# Patient Record
Sex: Female | Born: 1940 | Race: White | Hispanic: No | Smoking: Never smoker
Health system: Southern US, Community
[De-identification: ages and names within clinical notes are randomized; demographics above are authoritative.]

## PROBLEM LIST (undated history)

## (undated) DIAGNOSIS — E0590 Thyrotoxicosis, unspecified without thyrotoxic crisis or storm: Secondary | ICD-10-CM

## (undated) DIAGNOSIS — F329 Major depressive disorder, single episode, unspecified: Secondary | ICD-10-CM

## (undated) DIAGNOSIS — I10 Essential (primary) hypertension: Secondary | ICD-10-CM

## (undated) DIAGNOSIS — E0500 Thyrotoxicosis with diffuse goiter without thyrotoxic crisis or storm: Secondary | ICD-10-CM

## (undated) DIAGNOSIS — E669 Obesity, unspecified: Secondary | ICD-10-CM

## (undated) DIAGNOSIS — G4700 Insomnia, unspecified: Secondary | ICD-10-CM

## (undated) DIAGNOSIS — C50919 Malignant neoplasm of unspecified site of unspecified female breast: Secondary | ICD-10-CM

## (undated) DIAGNOSIS — K219 Gastro-esophageal reflux disease without esophagitis: Secondary | ICD-10-CM

## (undated) DIAGNOSIS — F411 Generalized anxiety disorder: Secondary | ICD-10-CM

## (undated) DIAGNOSIS — F32A Depression, unspecified: Secondary | ICD-10-CM

## (undated) DIAGNOSIS — M549 Dorsalgia, unspecified: Secondary | ICD-10-CM

## (undated) DIAGNOSIS — I480 Paroxysmal atrial fibrillation: Secondary | ICD-10-CM

## (undated) DIAGNOSIS — J309 Allergic rhinitis, unspecified: Secondary | ICD-10-CM

## (undated) DIAGNOSIS — K589 Irritable bowel syndrome without diarrhea: Secondary | ICD-10-CM

## (undated) DIAGNOSIS — E785 Hyperlipidemia, unspecified: Secondary | ICD-10-CM

## (undated) HISTORY — PX: EYE SURGERY: SHX253

## (undated) HISTORY — DX: Hyperlipidemia, unspecified: E78.5

## (undated) HISTORY — DX: Insomnia, unspecified: G47.00

## (undated) HISTORY — DX: Depression, unspecified: F32.A

## (undated) HISTORY — DX: Thyrotoxicosis, unspecified without thyrotoxic crisis or storm: E05.90

## (undated) HISTORY — DX: Obesity, unspecified: E66.9

## (undated) HISTORY — DX: Essential (primary) hypertension: I10

## (undated) HISTORY — DX: Malignant neoplasm of unspecified site of unspecified female breast: C50.919

## (undated) HISTORY — DX: Gastro-esophageal reflux disease without esophagitis: K21.9

## (undated) HISTORY — DX: Irritable bowel syndrome, unspecified: K58.9

## (undated) HISTORY — PX: BREAST LUMPECTOMY: SHX2

## (undated) HISTORY — DX: Thyrotoxicosis with diffuse goiter without thyrotoxic crisis or storm: E05.00

## (undated) HISTORY — DX: Dorsalgia, unspecified: M54.9

## (undated) HISTORY — DX: Generalized anxiety disorder: F41.1

## (undated) HISTORY — DX: Allergic rhinitis, unspecified: J30.9

## (undated) HISTORY — DX: Paroxysmal atrial fibrillation: I48.0

## (undated) HISTORY — DX: Major depressive disorder, single episode, unspecified: F32.9

---

## 1998-09-05 ENCOUNTER — Other Ambulatory Visit: Admission: RE | Admit: 1998-09-05 | Discharge: 1998-09-05 | Payer: Self-pay | Admitting: Obstetrics and Gynecology

## 2000-06-16 ENCOUNTER — Encounter: Payer: Self-pay | Admitting: *Deleted

## 2000-06-16 ENCOUNTER — Encounter: Admission: RE | Admit: 2000-06-16 | Discharge: 2000-06-16 | Payer: Self-pay | Admitting: *Deleted

## 2000-06-23 ENCOUNTER — Encounter: Admission: RE | Admit: 2000-06-23 | Discharge: 2000-06-23 | Payer: Self-pay | Admitting: *Deleted

## 2000-06-23 ENCOUNTER — Encounter: Payer: Self-pay | Admitting: *Deleted

## 2000-06-23 ENCOUNTER — Other Ambulatory Visit: Admission: RE | Admit: 2000-06-23 | Discharge: 2000-06-23 | Payer: Self-pay | Admitting: *Deleted

## 2000-06-30 ENCOUNTER — Other Ambulatory Visit: Admission: RE | Admit: 2000-06-30 | Discharge: 2000-06-30 | Payer: Self-pay | Admitting: *Deleted

## 2000-07-30 ENCOUNTER — Encounter: Payer: Self-pay | Admitting: Surgery

## 2000-07-31 ENCOUNTER — Encounter (INDEPENDENT_AMBULATORY_CARE_PROVIDER_SITE_OTHER): Payer: Self-pay | Admitting: Specialist

## 2000-07-31 ENCOUNTER — Encounter: Payer: Self-pay | Admitting: Surgery

## 2000-07-31 ENCOUNTER — Ambulatory Visit (HOSPITAL_COMMUNITY): Admission: RE | Admit: 2000-07-31 | Discharge: 2000-08-01 | Payer: Self-pay | Admitting: Surgery

## 2000-08-12 ENCOUNTER — Encounter: Admission: RE | Admit: 2000-08-12 | Discharge: 2000-11-10 | Payer: Self-pay | Admitting: Radiation Oncology

## 2000-08-26 ENCOUNTER — Ambulatory Visit (HOSPITAL_COMMUNITY): Admission: RE | Admit: 2000-08-26 | Discharge: 2000-08-26 | Payer: Self-pay | Admitting: Oncology

## 2000-08-26 ENCOUNTER — Encounter: Payer: Self-pay | Admitting: Oncology

## 2000-08-28 ENCOUNTER — Encounter: Payer: Self-pay | Admitting: Surgery

## 2000-08-28 ENCOUNTER — Ambulatory Visit (HOSPITAL_COMMUNITY): Admission: RE | Admit: 2000-08-28 | Discharge: 2000-08-28 | Payer: Self-pay | Admitting: Surgery

## 2000-09-01 ENCOUNTER — Ambulatory Visit (HOSPITAL_COMMUNITY): Admission: RE | Admit: 2000-09-01 | Discharge: 2000-09-01 | Payer: Self-pay | Admitting: Oncology

## 2001-04-01 ENCOUNTER — Ambulatory Visit: Admission: RE | Admit: 2001-04-01 | Discharge: 2001-06-30 | Payer: Self-pay | Admitting: Radiation Oncology

## 2001-04-28 ENCOUNTER — Encounter: Admission: RE | Admit: 2001-04-28 | Discharge: 2001-04-28 | Payer: Self-pay | Admitting: Radiation Oncology

## 2001-07-01 ENCOUNTER — Ambulatory Visit: Admission: RE | Admit: 2001-07-01 | Discharge: 2001-09-29 | Payer: Self-pay | Admitting: Radiation Oncology

## 2001-08-17 ENCOUNTER — Ambulatory Visit: Admission: RE | Admit: 2001-08-17 | Discharge: 2001-08-17 | Payer: Self-pay | Admitting: Radiation Oncology

## 2001-09-28 ENCOUNTER — Ambulatory Visit (HOSPITAL_BASED_OUTPATIENT_CLINIC_OR_DEPARTMENT_OTHER): Admission: RE | Admit: 2001-09-28 | Discharge: 2001-09-28 | Payer: Self-pay | Admitting: Surgery

## 2001-11-03 ENCOUNTER — Other Ambulatory Visit: Admission: RE | Admit: 2001-11-03 | Discharge: 2001-11-03 | Payer: Self-pay | Admitting: Urology

## 2002-06-01 ENCOUNTER — Encounter: Admission: RE | Admit: 2002-06-01 | Discharge: 2002-06-01 | Payer: Self-pay | Admitting: Oncology

## 2002-06-01 ENCOUNTER — Encounter: Payer: Self-pay | Admitting: Oncology

## 2002-08-26 ENCOUNTER — Encounter: Admission: RE | Admit: 2002-08-26 | Discharge: 2002-11-07 | Payer: Self-pay | Admitting: Surgery

## 2003-06-06 ENCOUNTER — Encounter: Admission: RE | Admit: 2003-06-06 | Discharge: 2003-06-06 | Payer: Self-pay | Admitting: Oncology

## 2003-09-01 ENCOUNTER — Ambulatory Visit (HOSPITAL_COMMUNITY): Admission: RE | Admit: 2003-09-01 | Discharge: 2003-09-01 | Payer: Self-pay | Admitting: Oncology

## 2003-09-07 ENCOUNTER — Ambulatory Visit: Admission: RE | Admit: 2003-09-07 | Discharge: 2003-09-07 | Payer: Self-pay | Admitting: Family Medicine

## 2004-03-15 ENCOUNTER — Ambulatory Visit (HOSPITAL_COMMUNITY): Admission: RE | Admit: 2004-03-15 | Discharge: 2004-03-15 | Payer: Self-pay | Admitting: Oncology

## 2004-04-05 ENCOUNTER — Encounter: Admission: RE | Admit: 2004-04-05 | Discharge: 2004-04-05 | Payer: Self-pay | Admitting: Oncology

## 2004-06-28 ENCOUNTER — Encounter: Admission: RE | Admit: 2004-06-28 | Discharge: 2004-06-28 | Payer: Self-pay | Admitting: Oncology

## 2004-09-10 ENCOUNTER — Ambulatory Visit: Payer: Self-pay | Admitting: Oncology

## 2005-02-03 ENCOUNTER — Encounter: Admission: RE | Admit: 2005-02-03 | Discharge: 2005-02-03 | Payer: Self-pay

## 2005-02-05 ENCOUNTER — Encounter: Admission: RE | Admit: 2005-02-05 | Discharge: 2005-02-05 | Payer: Self-pay

## 2005-02-26 ENCOUNTER — Encounter: Admission: RE | Admit: 2005-02-26 | Discharge: 2005-02-26 | Payer: Self-pay

## 2005-03-29 ENCOUNTER — Encounter: Admission: RE | Admit: 2005-03-29 | Discharge: 2005-03-29 | Payer: Self-pay

## 2005-04-29 ENCOUNTER — Ambulatory Visit: Payer: Self-pay | Admitting: Oncology

## 2005-07-01 ENCOUNTER — Encounter: Admission: RE | Admit: 2005-07-01 | Discharge: 2005-07-01 | Payer: Self-pay | Admitting: Oncology

## 2005-07-12 ENCOUNTER — Ambulatory Visit: Payer: Self-pay | Admitting: Oncology

## 2005-08-20 ENCOUNTER — Encounter: Admission: RE | Admit: 2005-08-20 | Discharge: 2005-08-20 | Payer: Self-pay | Admitting: Oncology

## 2005-10-11 ENCOUNTER — Ambulatory Visit: Payer: Self-pay | Admitting: Oncology

## 2006-04-15 ENCOUNTER — Ambulatory Visit: Payer: Self-pay | Admitting: Oncology

## 2006-08-20 ENCOUNTER — Encounter: Admission: RE | Admit: 2006-08-20 | Discharge: 2006-08-20 | Payer: Self-pay | Admitting: Oncology

## 2006-10-14 ENCOUNTER — Ambulatory Visit: Payer: Self-pay | Admitting: Oncology

## 2007-06-03 ENCOUNTER — Other Ambulatory Visit: Admission: RE | Admit: 2007-06-03 | Discharge: 2007-06-03 | Payer: Self-pay | Admitting: Family Medicine

## 2008-02-12 ENCOUNTER — Encounter
Admission: RE | Admit: 2008-02-12 | Discharge: 2008-02-12 | Payer: Self-pay | Admitting: Physical Medicine and Rehabilitation

## 2008-02-22 ENCOUNTER — Encounter: Admission: RE | Admit: 2008-02-22 | Discharge: 2008-02-22 | Payer: Self-pay | Admitting: Cardiology

## 2008-06-03 ENCOUNTER — Encounter: Admission: RE | Admit: 2008-06-03 | Discharge: 2008-06-03 | Payer: Self-pay | Admitting: Oncology

## 2009-12-27 ENCOUNTER — Ambulatory Visit: Payer: Self-pay | Admitting: Internal Medicine

## 2009-12-27 ENCOUNTER — Inpatient Hospital Stay (HOSPITAL_COMMUNITY): Admission: EM | Admit: 2009-12-27 | Discharge: 2009-12-30 | Payer: Self-pay | Admitting: Emergency Medicine

## 2009-12-28 ENCOUNTER — Encounter: Payer: Self-pay | Admitting: Gastroenterology

## 2009-12-29 ENCOUNTER — Encounter: Payer: Self-pay | Admitting: Internal Medicine

## 2009-12-29 HISTORY — PX: CHOLECYSTECTOMY: SHX55

## 2010-01-08 ENCOUNTER — Encounter: Admission: RE | Admit: 2010-01-08 | Discharge: 2010-01-08 | Payer: Self-pay | Admitting: General Surgery

## 2010-01-09 ENCOUNTER — Encounter: Payer: Self-pay | Admitting: Gastroenterology

## 2010-01-24 ENCOUNTER — Encounter (INDEPENDENT_AMBULATORY_CARE_PROVIDER_SITE_OTHER): Payer: Self-pay | Admitting: *Deleted

## 2010-02-27 ENCOUNTER — Encounter: Admission: RE | Admit: 2010-02-27 | Discharge: 2010-02-27 | Payer: Self-pay | Admitting: Family Medicine

## 2010-03-22 ENCOUNTER — Ambulatory Visit (HOSPITAL_COMMUNITY): Admission: RE | Admit: 2010-03-22 | Discharge: 2010-03-22 | Payer: Self-pay | Admitting: Cardiology

## 2010-03-22 ENCOUNTER — Encounter (INDEPENDENT_AMBULATORY_CARE_PROVIDER_SITE_OTHER): Payer: Self-pay | Admitting: Cardiology

## 2010-04-14 ENCOUNTER — Encounter: Admission: RE | Admit: 2010-04-14 | Discharge: 2010-04-14 | Payer: Self-pay | Admitting: Family Medicine

## 2010-05-29 ENCOUNTER — Encounter: Admission: RE | Admit: 2010-05-29 | Discharge: 2010-05-29 | Payer: Self-pay | Admitting: Neurological Surgery

## 2010-08-18 ENCOUNTER — Encounter: Payer: Self-pay | Admitting: Oncology

## 2010-08-19 ENCOUNTER — Encounter: Payer: Self-pay | Admitting: Family Medicine

## 2010-08-19 ENCOUNTER — Encounter: Payer: Self-pay | Admitting: Oncology

## 2010-08-30 NOTE — Procedures (Signed)
Summary: ERCP  Patient: Amilliana Hayworth Note: All result statuses are Final unless otherwise noted.  Tests: (1) ERCP (ERC)   ERC ERCP                  DONE     [facility]     [address] [place], [place]  [address]           ERCP PROCEDURE REPORT           PATIENT:  Vanshika, Jastrzebski  MR#:  [id number]     BIRTHDATE:  27-Mar-1941  GENDER:  female           ENDOSCOPIST:  Barbette Hair. Arlyce Dice, MD     ASSISTANT:           PROCEDURE DATE:  12/28/2009     PROCEDURE:  ERCP with removal of stones, ERCP with sphincterotomy           INDICATIONS:  suspected stone           MEDICATIONS:   Fentanyl 90 mcg IV, Versed 10 mg IV, Benadryl 50 mg     IV, glycopyrrolate (Robinal) 0.2 mg IV     TOPICAL ANESTHETIC:  Cetacaine Spray           DESCRIPTION OF PROCEDURE:   After the risks benefits and     alternatives of the procedure were thoroughly explained, informed     consent was obtained.  The  endoscope was introduced through the     mouth and advanced to the third portion of the duodenum.           Multiple stones were found. Bile duct stone was seen impacted at     the ampulla.     CBD was selectively cannulated. A 15mm sphincterotomy was done.     Duct was swept multiple times with a 12mm balloon stone extractor.     2 10-30mm stones were delivered into the duodenum (see image1 and     image4). Final cholangiogram was WNL    The scope was then     completely withdrawn from the patient and the procedure     terminated.     <<PROCEDUREIMAGES>>           COMPLICATIONS:  None           ENDOSCOPIC IMPRESSION:     1) Stones, multiple - s/p sphincterotomy and stone extraction     RECOMMENDATIONS:Cholecystectomy           ______________________________     Barbette Hair. Arlyce Dice, MD           CC:  Merri Brunette, M.D.     Hedwig Morton. Juanda Chance, M.D.           n.     eSIGNED:   Barbette Hair. Koralyn Prestage at 12/28/2009 04:39 PM           Lenna Gilford, [id number]  Note: An exclamation mark (!)  indicates a result that was not dispersed into the flowsheet. Document Creation Date: 12/28/2009 4:56 PM _______________________________________________________________________  (1) Order result status: Final Collection or observation date-time: 12/28/2009 16:29 Requested date-time:  Receipt date-time:  Reported date-time:  Referring Physician:   Ordering Physician: Melvia Heaps [phone]) Specimen Source:  Source: Launa Grill Order Number: [id number] Lab site:

## 2010-08-30 NOTE — Letter (Signed)
Summary: New Patient letter  [facility]  [address] [place], [place] [address]   Phone: [phone]  Fax: [phone]       01/24/2010 MRN: [id number]  Connie Daniel [address] [place], [place]  [address]  [place]  Dear Connie Daniel,  Welcome to the Gastroenterology Division at [facility].    You are scheduled to see Dr.   Arlyce Dice on 03-06-10 at 11am on the 3rd floor at [facility], [address]  We ask that you try to arrive at our office 15 minutes prior to your appointment time to allow for check-in.  We would like you to complete the enclosed self-administered evaluation form prior to your visit and bring it with you on the day of your appointment.  We will review it with you.  Also, please bring a complete list of all your medications or, if you prefer, bring the medication bottles and we will list them.  Please bring your insurance card so that we may make a copy of it.  If your insurance requires a referral to see a specialist, please bring your referral form from your primary care physician.  Co-payments are due at the time of your visit and may be paid by cash, check or credit card.     Your office visit will consist of a consult with your physician (includes a physical exam), any laboratory testing he/she may order, scheduling of any necessary diagnostic testing (e.g. x-ray, ultrasound, CT-scan), and scheduling of a procedure (e.g. Endoscopy, Colonoscopy) if required.  Please allow enough time on your schedule to allow for any/all of these possibilities.    If you cannot keep your appointment, please call [phone] to cancel or reschedule prior to your appointment date.  This allows [place] the opportunity to schedule an appointment for another patient in need of care.  If you do not cancel or reschedule by 5 p.m. the business day prior to your appointment date, you will be charged a $50.00 late cancellation/no-show fee.    Thank you for choosing  [facility] for your medical needs.  We appreciate the opportunity to care for you.  Please visit [place] at our website  to learn more about our practice.                     Sincerely,                                                             The Gastroenterology Division

## 2010-08-30 NOTE — Letter (Signed)
Summary: [facility]   Imported By: Sherian Rein 02/07/2010 09:51:37  _____________________________________________________________________  External Attachment:    Type:   Image     Comment:   External Document

## 2010-10-15 LAB — DIFFERENTIAL
Basophils Absolute: 0 10*3/uL (ref 0.0–0.1)
Eosinophils Absolute: 0 10*3/uL (ref 0.0–0.7)
Eosinophils Absolute: 0 10*3/uL (ref 0.0–0.7)
Eosinophils Relative: 1 % (ref 0–5)
Lymphocytes Relative: 20 % (ref 12–46)
Lymphs Abs: 0.6 10*3/uL — ABNORMAL LOW (ref 0.7–4.0)
Lymphs Abs: 1.3 10*3/uL (ref 0.7–4.0)
Monocytes Absolute: 0.5 10*3/uL (ref 0.1–1.0)
Monocytes Absolute: 0.6 10*3/uL (ref 0.1–1.0)
Monocytes Relative: 5 % (ref 3–12)
Neutrophils Relative %: 89 % — ABNORMAL HIGH (ref 43–77)

## 2010-10-15 LAB — COMPREHENSIVE METABOLIC PANEL
ALT: 65 U/L — ABNORMAL HIGH (ref 0–35)
AST: 68 U/L — ABNORMAL HIGH (ref 0–37)
Albumin: 4.4 g/dL (ref 3.5–5.2)
Alkaline Phosphatase: 84 U/L (ref 39–117)
Calcium: 9.6 mg/dL (ref 8.4–10.5)
GFR calc Af Amer: 60 mL/min — ABNORMAL LOW (ref >60)
Potassium: 3.4 mEq/L — ABNORMAL LOW (ref 3.5–5.1)
Sodium: 142 mEq/L (ref 135–145)
Total Protein: 8.1 g/dL (ref 6.0–8.3)

## 2010-10-15 LAB — CBC
HCT: 38 % (ref 36.0–46.0)
Hemoglobin: 12.7 g/dL (ref 12.0–15.0)
MCHC: 33.2 g/dL (ref 30.0–36.0)
MCV: 86.1 fL (ref 78.0–100.0)
Platelets: 190 10*3/uL (ref 150–400)
RDW: 15.4 % (ref 11.5–15.5)
RDW: 15.8 % — ABNORMAL HIGH (ref 11.5–15.5)

## 2010-10-15 LAB — HEPATIC FUNCTION PANEL
ALT: 68 U/L — ABNORMAL HIGH (ref 0–35)
AST: 53 U/L — ABNORMAL HIGH (ref 0–37)
Albumin: 3.3 g/dL — ABNORMAL LOW (ref 3.5–5.2)
Alkaline Phosphatase: 72 U/L (ref 39–117)
Bilirubin, Direct: 0.2 mg/dL (ref 0.0–0.3)
Indirect Bilirubin: 1 mg/dL — ABNORMAL HIGH (ref 0.3–0.9)

## 2010-10-15 LAB — LIPASE, BLOOD: Lipase: 26 U/L (ref 11–59)

## 2010-10-15 LAB — CREATININE, SERUM
Creatinine, Ser: 1.03 mg/dL (ref 0.4–1.2)
GFR calc non Af Amer: 53 mL/min — ABNORMAL LOW (ref >60)

## 2010-10-15 LAB — AMYLASE: Amylase: 137 U/L — ABNORMAL HIGH (ref 0–105)

## 2010-12-14 NOTE — Op Note (Signed)
[place]  Patient:    Connie Daniel, DIEP Visit Number: [id number] MRN: [id number]          Service Type: DSU Location: [facility] Attending Physician:  Bonnetta Barry Dictated by:   Velora Heckler, M.D. Proc. Date: 09/28/01 Admit Date:  09/28/2001   CC:         Dario Guardian, M.D.  Leighton Roach. Truett Perna, M.D.   Operative Report  PREOPERATIVE DIAGNOSIS:  History of breast carcinoma.  POSTOPERATIVE DIAGNOSIS:  History of breast carcinoma.  OPERATION PERFORMED:  Removal of infusion port.  SURGEON:  Velora Heckler, M.D.  ANESTHESIA:  Local with intravenous sedation.  PREPARATION:  Betadine.  COMPLICATIONS:  None.  INDICATIONS FOR PROCEDURE:  The patient is a 70 year old white female diagnosed in January of 2002 with bilateral breast carcinoma.  She underwent bilateral partial mastectomy and sentinel lymph node biopsy.  The patient received adjuvant chemotherapy.  She now comes to surgery for removal of infusion port.  DESCRIPTION OF PROCEDURE:  The procedure was done in OR #3 at the [facility].  The patient was brought to the operating room and placed in supine position on the operating room table.  Following administration of intravenous sedation, the patient was prepped and draped in the usual strict aseptic fashion.  After ascertaining that an adequate level of sedation had been obtained, the previous incision in the upper right chest wall was anesthetized with local anesthetic.  Using the #15 blade the incision was opened for 3 cm.  Dissection was carried through subcutaneous tissues.  The Port-A-Cath was mobilized.  All three Prolene sutures securing the Port-A-Cath to the chest wall were excised.  The catheter was withdrawn completely.  The catheter tract was closed with a figure-of-eight 3-0 Vicryl.  Fibrous sheath around the port was obliterated  with the electrocautery. The subcutaneous tissues were  reapproximated with interrupted 3-0 Vicryl sutures.  The skin was closed with interrupted 4-0 Vicryl subcuticular sutures.  The wound was washed and dried and benzoin and Steri-Strips are applied.  Sterile gauze dressings was applied. The patient was awakened from anesthesia and brought to the recovery room in stable condition.  The patient tolerated the procedure well. Dictated by:   Velora Heckler, M.D. Attending Physician:  Bonnetta Barry DD:  09/28/01 TD:  09/28/01 Job: [id number]

## 2010-12-14 NOTE — Op Note (Signed)
. [facility]  Patient:    Connie Daniel, Connie Daniel                      MRN: [id number] Proc. Date: 07/31/00 Attending:  Velora Heckler, M.D. CC:         Dario Guardian, M.D.  Sung Amabile Roslyn Smiling, M.D.  Tera Mater. Evlyn Kanner, M.D.   Operative Report  PREOPERATIVE DIAGNOSIS:  Bilateral breast carcinoma.  POSTOPERATIVE DIAGNOSIS:  Bilateral breast carcinoma.  PROCEDURES: 1. Left partial mastectomy. 2. Left axillary sentinel lymph node biopsy. 3. Left axillary node dissection. 4. Right partial mastectomy. 5. Right axillary sentinel lymph node biopsy.  SURGEON:  Velora Heckler, M.D.  ANESTHESIA:  General.  ESTIMATED BLOOD LOSS:  Minimal.  PREPARATION:  Betadine.  COMPLICATIONS:  None.  INDICATIONS:  The patient is a 70 year old white female who presented following routine mammography.  This was performed November 19.  Lesions were seen in each breast.  The patient subsequently underwent core needle biopsy of both lesions at the [facility] in [place] on June 23, 2000. 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  The patient was seen in consultation with plastic surgery for consideration for a mastectomy and reconstruction.  The patient opted for breast conservation and now comes to surgery for partial mastectomy and sentinel left node biopsy.  FINDINGS AT SURGERY:  The left axilla sentinel lymph node demonstrated metastatic carcinoma.  Axillary dissection was then performed.  Touch preps of the bilateral partial mastectomy specimens showed negative surgical margins. Right axillary sentinel lymph node biopsy was negative by touch prep.  DESCRIPTION OF PROCEDURE:  The procedure was done in OR #17 at the [facility].  The patient was brought to the operating room and placed in a supine position on the operating room table.  Following the administration of general anesthesia, the patient is prepped and  draped in the usual strict aseptic fashion.  After ascertaining an adequate level of anesthesia had been obtained, ______  and blue dye is injected in the periareolar tissues bilaterally and massaged for five minutes.  NeoProbe is used to localize the site of sentinel lymph nodes in both axillae.  Beginning in the left axilla, a high axillary incision was made with a #10 blade. Dissection was carried down into the subcutaneous tissues and hemostasis obtained with the electrocautery.  A blue lymphatic is identified and followed into the low axilla.  Using the NeoProbe for guidance, a blue radioactive lymph node is identified and excised.  A second blue radioactive lymph node is also identified and labeled sentinel lymph node #2.  These are submitted to pathology for review along with additional left axillary content which was not radioactive nor blue in color.  Dr. Reuel Boom in the department of pathology stated that the first sentinel lymph node contained metastatic carcinoma.  The second sentinel lymph node had atypical cells.  Therefore, we proceeded with left axillary lymph node dissection.  The axillary wound was extended slightly.  Dissection was carried along the edge of the pectoralis major muscle and down to the chest wall.  Axillary vein was identified.  Wall of thoracic and thoracodorsal nerves were identified and preserved.  Axillary contents were excised.  Venous and arterial tributaries were divided between medium Ligaclips.  Axillary contents were completely excised and submitted fresh to pathology for review.  The #10 Jackson-Pratt drain was brought in from an inferior stab wound and secured to the skin  with a 3-0 nylon suture. Wound was closed with stainless steel staples.  Next, an incision is made on the lateral left breast at the site of guidewire insertion.  Dissection is carried into the subcutaneous tissues.  Guidewires followed deeply into the breast tissue and  down towards the lateral chest wall.  At one point, the tumor is closely approached during the dissection.  This tissue is reapproximated with interrupted silk sutures.  A wider margin of dissection is then completed around the tumor which is quite deep and near the chest wall. The tumor is never actually encountered directly into dissection.  The tissue is excised and after explaining to Dr. Francoise Schaumann the purpose of the silk sutures, touch preps were performed which were negative on the surgical margins.  An additional rim of tissue is taken from the superior margin, as this is the area where the tumor was most closely approached.  This was submitted separately, labeled superior margin left partial mastectomy wound.  It will be processed for permanent sectioning.  Good hemostasis was obtained throughout. Margins of dissection are marked with medium Ligaclips.  Skin is closed with stainless steel staples.  Next we turned our attention to the right side.  Again, a right high axillary incision is made and dissection carried into the subcutaneous tissues. Hemostasis was obtained with the electrocautery.  Sentinel lymph node is again localized using the NeoProbe.  Two blue lymph nodes are identified.  These are dissected out.  These are submitted, labeled sentinel lymph node #1 and sentinel lymph node #2.  Additional axillary content is also submitted for permanent sectioning only.  Dr. Charlott Rakes reviewed these two lymph nodes and states that they are negative for metastatic disease.  Axillary wound was closed with stainless steel staples after achieving adequate hemostasis. Next, a curvilinear incision was made on the lateral right breast at the site of guidewire insertion.  Again, dissection was carried down to the subcutaneous tissues.  This tumor is more superficially and medially located.  A wide excision of tissue is taken around the mass which is palpable.  The electrocautery was used  for hemostasis.  Partial mastectomy specimen is submitted to pathology.  Touch preps on the margins are negative according to Dr. Reuel Boom.  Margins of dissection are marked with medium Ligaclips and good hemostasis obtained.  Skin is closed with stainless steel staples.  All wounds are washed and dried, and sterile gauze dressings are placed.  The patient is awakened from anesthesia and brought to the recovery room in stable condition.  The patient tolerated the procedure well. DD:  07/31/00 TD:  07/31/00 Job: [id number]

## 2010-12-14 NOTE — Op Note (Signed)
[facility]  Patient:    Connie Daniel, Connie Daniel                        MRN: [id number] Proc. Date: 08/28/00 Adm. Date:  [id number] Attending:  Bonnetta Barry CC:         Dario Guardian, M.D.  Tera Mater. Evlyn Kanner, M.D.  Sung Amabile Roslyn Smiling, M.D.  Leighton Roach. Truett Perna, M.D.   Operative Report  PREOPERATIVE DIAGNOSIS:  Bilateral breast cancer.  POSTOPERATIVE DIAGNOSIS:  Bilateral breast cancer.  PROCEDURE:  Placement of right subclavian vein infusion port.  SURGEON:  Velora Heckler, M.D.  ANESTHESIA:  Local with intravenous sedation.  PREPARATION:  Betadine.  COMPLICATIONS:  None.  BLOOD LOSS:  Minimal.  INDICATIONS:  The patient is a 70 year old white female being treated for bilateral breast carcinoma.  She is to begin treatment with adjuvant chemotherapy.  She and her oncologist desire chronic venous access.  DESCRIPTION OF PROCEDURE:  The procedure was done in OR #1 at the [facility].  The patient was brought to the operating room and placed in a supine position on the operating room table.  Following the administration of intravenous sedation, the patient is positioned and then prepped and draped in the usual strict aseptic fashion.  After ascertaining an adequate level of sedation had been obtained, the skin beneath the right clavicle was anesthetized with local anesthetic.  The patient was placed in Trendelenburg.  Using an 18 gauge Seldinger needle, the right subclavian vein is entered in a single pass.  A soft tissue guidewire was inserted and the needle removed.  C-arm fluoroscopy confirms guidewire placement.  Next, a pocket for the port is fashioned on the right chest wall.  Skin is anesthetized with local anesthetic.  A 3 cm incision is made with a #15 blade. Dissection is carried down through the subcutaneous tissues and hemostasis obtained with the electrocautery.  A subcutaneous pocket is created.  A single lumen Bard  9.6 French infusion port is brought on the field.  It is flushed with heparinized saline.  The port is secured at three locations with interrupted 2-0 Prolene sutures to the chest wall.  The catheter is tunneled subcutaneously to the site of guidewire insertion.  The dilator is placed over the guidewire and used to dilate the subclavian tract.  A peel-away sleeve is then introduced over the guidewire into the right subclavian vein.  The guidewire and dilator are removed.  The catheter is inserted through the peel-away sleeve into position and the peel-away sleeve removed.  C-arm fluoroscopy is again used to confirm catheter placement.  Port aspirates quite easily and flushes easily with heparinized saline.  It is flushed with 5 cc of 100 unit per cc heparin.  Subcutaneous tissues are reapproximated with interrupted 3-0 Vicryl sutures.  The skin is closed with interrupted 4-0 Vicryl subcuticular sutures.  The wounds are washed and dried, and Benzoin and Steri-Strips are applied.  Sterile gauze dressings are applied.  The patient is transferred to the recovery room in stable condition.  The patient tolerated the procedure well. DD:  08/28/00 TD:  08/28/00 Job: [id number]

## 2013-04-27 ENCOUNTER — Other Ambulatory Visit: Payer: Self-pay | Admitting: Cardiology

## 2013-04-27 DIAGNOSIS — I4891 Unspecified atrial fibrillation: Secondary | ICD-10-CM

## 2013-04-27 DIAGNOSIS — Z79899 Other long term (current) drug therapy: Secondary | ICD-10-CM

## 2013-06-10 ENCOUNTER — Encounter: Payer: Self-pay | Admitting: Cardiology

## 2013-06-11 ENCOUNTER — Encounter: Payer: Self-pay | Admitting: Cardiology

## 2013-06-11 ENCOUNTER — Ambulatory Visit (INDEPENDENT_AMBULATORY_CARE_PROVIDER_SITE_OTHER): Payer: Medicare Other | Admitting: Cardiology

## 2013-06-11 ENCOUNTER — Encounter (INDEPENDENT_AMBULATORY_CARE_PROVIDER_SITE_OTHER): Payer: Self-pay

## 2013-06-11 VITALS — BP 100/80 | HR 72 | Ht 63.0 in | Wt 171.0 lb

## 2013-06-11 DIAGNOSIS — E785 Hyperlipidemia, unspecified: Secondary | ICD-10-CM | POA: Insufficient documentation

## 2013-06-11 DIAGNOSIS — I4891 Unspecified atrial fibrillation: Secondary | ICD-10-CM

## 2013-06-11 DIAGNOSIS — I10 Essential (primary) hypertension: Secondary | ICD-10-CM

## 2013-06-11 DIAGNOSIS — R0600 Dyspnea, unspecified: Secondary | ICD-10-CM | POA: Insufficient documentation

## 2013-06-11 DIAGNOSIS — R5383 Other fatigue: Secondary | ICD-10-CM | POA: Insufficient documentation

## 2013-06-11 NOTE — Progress Notes (Signed)
[address] [place], [place]  [address] Phone: [phone] Fax:  [phone]  Date:  06/11/2013   ID:  Connie Daniel, DOB [birth year], MRN [id number]  PCP:  Allean Found, MD   History of Present Illness: Connie Daniel is a 72 y.o. female with atrial fibrillation surrounding gallbladder surgery post TEE cardioversion with echocardiogram showing mild LVH, mild mitral valve prolapse and mild mitral regurgitation, nuclear stress test in 2009 showing overall low risk here for followup.  She has lost approximately 30 pounds on Atkins diet. Excellent. She is doing well. Her blood pressure was a little bit on the low side in clinic. 100/60. No symptoms.   Wt Readings from Last 3 Encounters:  06/11/13 171 lb (77.565 kg)     Past Medical History  Diagnosis Date  . Dyspnea   . Aortic valve sclerosis   . Fatigue   . Hyperlipidemia   . Depression   . Hypertension   . Back pain   . Obesity   . Atrial fibrillation   . Esophageal reflux   . Allergic rhinitis   . Anxiety state, unspecified   . IBS (irritable bowel syndrome)   . Insomnia   . Graves disease     , with opthalmolopathy  . Hyperthyroidism   . Breast cancer     s/p chemo    Past Surgical History  Procedure Laterality Date  . Eye surgery Bilateral     Due to Grave's disease  . Breast lumpectomy Bilateral   . Cholecystectomy  12/29/2009    Current Outpatient Prescriptions  Medication Sig Dispense Refill  . buPROPion (WELLBUTRIN XL) 150 MG 24 hr tablet Take 150 mg by mouth daily.      . Calcium Carb-Cholecalciferol (CALCIUM + D3) 600-200 MG-UNIT TABS Take 2 tablets by mouth daily.      . cholestyramine (QUESTRAN) 4 GM/DOSE powder Take one scoop twice a day as needed      . esomeprazole (NEXIUM) 40 MG capsule Take 40 mg by mouth daily as needed.      . fexofenadine (ALLEGRA) 180 MG tablet Take 180 mg by mouth daily as needed for allergies or rhinitis.      . fish oil-omega-3 fatty acids  1000 MG capsule Take 1 g by mouth daily.      . fluticasone (FLONASE) 50 MCG/ACT nasal spray Place 1-2 sprays into both nostrils daily.      . hydrochlorothiazide (HYDRODIURIL) 25 MG tablet Take 25 mg by mouth daily.      Marland Kitchen lisinopril (PRINIVIL,ZESTRIL) 5 MG tablet Take 5 mg by mouth daily.      . metoprolol succinate (TOPROL-XL) 50 MG 24 hr tablet Take 50 mg by mouth daily. Take with or immediately following a meal.      . oxymetazoline (AFRIN) 0.05 % nasal spray Place 1 spray into both nostrils 2 (two) times daily as needed for congestion.      . pentosan polysulfate (ELMIRON) 100 MG capsule Take 100 mg by mouth as needed.      . rivaroxaban (XARELTO) 10 MG TABS tablet Take 10 mg by mouth daily.      . sertraline (ZOLOFT) 100 MG tablet Take 100 mg by mouth daily.      Marland Kitchen zolpidem (AMBIEN) 10 MG tablet Take 10 mg by mouth at bedtime.       No current facility-administered medications for this visit.    Allergies:    Allergies  Allergen  Reactions  . Sulfa Antibiotics Nausea Only  . Vicodin [Hydrocodone-Acetaminophen] Other (See Comments)    Upset stomach    Social History:  The patient  reports that she has never smoked. She does not have any smokeless tobacco history on file. She reports that she drinks alcohol. She reports that she does not use illicit drugs.   ROS:  Please see the history of present illness.   No syncope, no orthopnea, no PND, chest pain    PHYSICAL EXAM: VS:  BP 100/80  Pulse 72  Ht 5\' 3"  (1.6 m)  Wt 171 lb (77.565 kg)  BMI 30.30 kg/m2 Well nourished, well developed, in no acute distress HEENT: normal Neck: no JVD Cardiac:  Irregularly irregular, normal rate; no murmur Lungs:  clear to auscultation bilaterally, no wheezing, rhonchi or rales Abd: soft, nontender, no hepatomegaly Ext: no edema Skin: warm and dry Neuro: no focal abnormalities noted  EKG:  None today     ASSESSMENT AND PLAN:  1. Atrial fibrillation-persistent, permanent, rate  controlled. 2. Obesity-excellent Job with weight loss.  3. Hypertension-excellent control. If blood pressure becomes too low, we may need to pull back medication. First it may be ACE inhibitor. 4. Dyspnea-multifactorial. Improved with weight loss however.  Signed, Donato Schultz, MD [facility]  06/11/2013 2:57 PM

## 2013-06-11 NOTE — Patient Instructions (Signed)
Your physician recommends that you continue on your current medications as directed. Please refer to the Current Medication list given to you today.  Your physician recommends that you return for lab work in: 6 months , Lipid  Your physician wants you to follow-up in: 6 months with Dr. Skains. You will receive a reminder letter in the mail two months in advance. If you don't receive a letter, please call our office to schedule the follow-up appointment.   

## 2013-08-25 ENCOUNTER — Telehealth: Payer: Self-pay | Admitting: Cardiology

## 2013-09-28 ENCOUNTER — Other Ambulatory Visit: Payer: Self-pay | Admitting: Cardiology

## 2013-09-29 NOTE — Telephone Encounter (Signed)
Should this be 10mg  or 20mg ?  Chart has 10mg , but the pharmacy is requesting 20mg . Please advise. Thanks, MI

## 2013-10-01 ENCOUNTER — Other Ambulatory Visit: Payer: Self-pay

## 2013-10-01 MED ORDER — RIVAROXABAN 10 MG PO TABS: 10.0000 mg | ORAL_TABLET | Freq: Every day | ORAL | Status: DC

## 2013-10-21 ENCOUNTER — Ambulatory Visit
Admission: RE | Admit: 2013-10-21 | Discharge: 2013-10-21 | Disposition: A | Discharge status: Home / Self Care | Payer: Medicare Other | Source: Ambulatory Visit | Attending: Family Medicine | Admitting: Family Medicine

## 2013-10-21 ENCOUNTER — Other Ambulatory Visit: Payer: Self-pay | Admitting: Family Medicine

## 2013-10-21 DIAGNOSIS — M79609 Pain in unspecified limb: Secondary | ICD-10-CM

## 2013-10-21 DIAGNOSIS — M25473 Effusion, unspecified ankle: Secondary | ICD-10-CM

## 2013-10-21 DIAGNOSIS — M25476 Effusion, unspecified foot: Principal | ICD-10-CM

## 2013-10-25 ENCOUNTER — Other Ambulatory Visit: Payer: Self-pay

## 2013-10-26 ENCOUNTER — Other Ambulatory Visit (INDEPENDENT_AMBULATORY_CARE_PROVIDER_SITE_OTHER): Payer: Medicare Other

## 2013-10-26 DIAGNOSIS — Z79899 Other long term (current) drug therapy: Secondary | ICD-10-CM

## 2013-10-26 DIAGNOSIS — I4891 Unspecified atrial fibrillation: Secondary | ICD-10-CM

## 2013-10-26 LAB — CBC
HEMATOCRIT: 40 % (ref 36.0–46.0)
Hemoglobin: 13.2 g/dL (ref 12.0–15.0)
MCHC: 32.9 g/dL (ref 30.0–36.0)
MCV: 86.6 fl (ref 78.0–100.0)
Platelets: 184 10*3/uL (ref 150.0–400.0)
RBC: 4.62 Mil/uL (ref 3.87–5.11)
RDW: 14.6 % (ref 11.5–14.6)
WBC: 8.5 10*3/uL (ref 4.5–10.5)

## 2013-10-26 LAB — CREATININE, SERUM: Creatinine, Ser: 1.1 mg/dL (ref 0.4–1.2)

## 2013-11-03 ENCOUNTER — Telehealth: Payer: Self-pay | Admitting: *Deleted

## 2013-11-03 NOTE — Telephone Encounter (Signed)
Patient calling to verify dosage of Xarelto. Last ov in was 10 mg daily new rx states 20mg  once daily and wants to know if this change is by Dr Marlou Porch. Will route to his nurse.

## 2013-11-03 NOTE — Telephone Encounter (Signed)
Left message for patient to call the office

## 2013-11-04 ENCOUNTER — Telehealth: Payer: Self-pay | Admitting: Cardiology

## 2013-11-04 NOTE — Telephone Encounter (Signed)
New message    Patient calling stating she  returning nurse/ cma called .

## 2013-11-05 MED ORDER — RIVAROXABAN 10 MG PO TABS: 10.0000 mg | ORAL_TABLET | Freq: Every day | ORAL | Status: DC

## 2013-11-05 NOTE — Telephone Encounter (Signed)
Spoke with patient she advised that she has always taken a 10mg  tablet of Xarelto per all her Rx bottles, Per [facility] medical records she was on a 20mg  tablet. Advised her that if she has always taken a 10 mg tablet to continue , I would let Dr. Marlou Porch know and get a  new Rx called in.

## 2013-11-05 NOTE — Addendum Note (Signed)
Addended by: Illana Nolting, Dola Factor D on: 11/05/2013 10:31 AM   Modules accepted: Orders, Medications

## 2013-11-05 NOTE — Telephone Encounter (Signed)
Spoke with patient regarding medication dosage, see previous telephone note.

## 2013-11-08 NOTE — Telephone Encounter (Signed)
Connie Daniel, please review her dosage of Xarelto. She is currently taking 10 mg. She states that she's been on this for quite some time. Appreciated.

## 2013-11-08 NOTE — Telephone Encounter (Signed)
Patient should be on Xarelto 20 mg qd (take in PM with food).  I spoke with [facility] and patient was taking Xarelto 20 mg qd up until 10/01/13 when Xarelto 10 mg was sent in.  On 3/27 Xarelto 20 mg qd was sent in, and then on 11/05/13 Xarelto 10 mg was sent in again to pharmacy.   I instructed pharmacy to discontinue the Xarelto 10 mg qd prescription, and to only fill the Xarelto 20 mg qd one.  She has 3 refills left.    Keny[place], please call patient and instruct her to take Xarelto 20 mg qPM with food and that she has refills for this already at the pharmacy.  Thanks.

## 2013-11-09 ENCOUNTER — Other Ambulatory Visit: Payer: Self-pay

## 2013-11-09 MED ORDER — RIVAROXABAN 20 MG PO TABS: 20.0000 mg | ORAL_TABLET | Freq: Every day | ORAL | Status: DC

## 2013-11-09 NOTE — Telephone Encounter (Signed)
LVM fro patient to call the office.

## 2013-11-09 NOTE — Telephone Encounter (Signed)
Advised patient that she should be taking Xarelto 20 mg qPM. Patient verbalized understanding

## 2013-11-10 NOTE — Telephone Encounter (Signed)
error 

## 2013-12-08 ENCOUNTER — Other Ambulatory Visit: Payer: Medicare Other

## 2013-12-08 ENCOUNTER — Encounter: Payer: Self-pay | Admitting: Cardiology

## 2013-12-08 ENCOUNTER — Ambulatory Visit (INDEPENDENT_AMBULATORY_CARE_PROVIDER_SITE_OTHER): Payer: Medicare Other | Admitting: Cardiology

## 2013-12-08 VITALS — BP 118/60 | HR 75 | Ht 60.0 in | Wt 174.0 lb

## 2013-12-08 DIAGNOSIS — Z7901 Long term (current) use of anticoagulants: Secondary | ICD-10-CM

## 2013-12-08 DIAGNOSIS — I4891 Unspecified atrial fibrillation: Secondary | ICD-10-CM

## 2013-12-08 DIAGNOSIS — E785 Hyperlipidemia, unspecified: Secondary | ICD-10-CM

## 2013-12-08 DIAGNOSIS — E669 Obesity, unspecified: Secondary | ICD-10-CM

## 2013-12-08 DIAGNOSIS — I10 Essential (primary) hypertension: Secondary | ICD-10-CM

## 2013-12-08 NOTE — Patient Instructions (Signed)
Your physician recommends that you continue on your current medications as directed. Please refer to the Current Medication list given to you today.  Your physician wants you to follow-up in: 6 months with Dr. Skains. You will receive a reminder letter in the mail two months in advance. If you don't receive a letter, please call our office to schedule the follow-up appointment.  

## 2013-12-08 NOTE — Progress Notes (Signed)
[facility]. [address], [address] [place], [place]  [address] Phone: [phone] Fax:  [phone]  Date:  12/08/2013   ID:  Connie Daniel, DOB September 15, 1940, MRN [id number]  PCP:  Reginia Naas, MD   History of Present Illness: Connie Daniel is a 73 y.o. female with atrial fibrillation surrounding gallbladder surgery post TEE cardioversion with echocardiogram showing mild LVH, mild mitral valve prolapse and mild mitral regurgitation, nuclear stress test in 2009 showing overall low risk here for followup.  She has lost approximately 30 pounds on Atkins diet. Excellent. She is doing well. Her blood pressure was a little bit on the low side in clinic. 100/60. No symptoms.  Doing well.    Wt Readings from Last 3 Encounters:  12/08/13 174 lb (78.926 kg)  06/11/13 171 lb (77.565 kg)     Past Medical History  Diagnosis Date  . Dyspnea   . Aortic valve sclerosis   . Fatigue   . Hyperlipidemia   . Depression   . Hypertension   . Back pain   . Obesity   . Atrial fibrillation   . Esophageal reflux   . Allergic rhinitis   . Anxiety state, unspecified   . IBS (irritable bowel syndrome)   . Insomnia   . Graves disease     , with opthalmolopathy  . Hyperthyroidism   . Breast cancer     s/p chemo    Past Surgical History  Procedure Laterality Date  . Eye surgery Bilateral     Due to Grave's disease  . Breast lumpectomy Bilateral   . Cholecystectomy  12/29/2009    Current Outpatient Prescriptions  Medication Sig Dispense Refill  . alendronate (FOSAMAX) 70 MG tablet Take 70 mg by mouth once a week. Take with a full glass of water on an empty stomach.      Marland Kitchen buPROPion (WELLBUTRIN XL) 150 MG 24 hr tablet Take 150 mg by mouth daily.      . Calcium Carb-Cholecalciferol (CALCIUM + D3) 600-200 MG-UNIT TABS Take 2 tablets by mouth daily.      . cholestyramine (QUESTRAN) 4 GM/DOSE powder Take one scoop twice a day as needed      . esomeprazole (NEXIUM) 40 MG capsule Take 40 mg by  mouth daily as needed.      . fexofenadine (ALLEGRA) 180 MG tablet Take 180 mg by mouth daily as needed for allergies or rhinitis.      . fluticasone (FLONASE) 50 MCG/ACT nasal spray Place 1-2 sprays into both nostrils daily.      . hydrochlorothiazide (HYDRODIURIL) 25 MG tablet Take 25 mg by mouth daily.      Marland Kitchen lisinopril (PRINIVIL,ZESTRIL) 5 MG tablet Take 5 mg by mouth daily.      . metoprolol succinate (TOPROL-XL) 50 MG 24 hr tablet Take 50 mg by mouth daily. Take with or immediately following a meal.      . oxymetazoline (AFRIN) 0.05 % nasal spray Place 1 spray into both nostrils 2 (two) times daily as needed for congestion.      . pentosan polysulfate (ELMIRON) 100 MG capsule Take 100 mg by mouth as needed.      . rivaroxaban (XARELTO) 20 MG TABS tablet Take 1 tablet (20 mg total) by mouth daily with supper.  30 tablet  6  . sertraline (ZOLOFT) 100 MG tablet Take 100 mg by mouth daily.      . Vitamin D, Ergocalciferol, (DRISDOL) 50000 UNITS CAPS capsule Take  50,000 Units by mouth every 7 (seven) days.      Marland Kitchen zolpidem (AMBIEN) 10 MG tablet Take 10 mg by mouth at bedtime.       No current facility-administered medications for this visit.    Allergies:    Allergies  Allergen Reactions  . Sulfa Antibiotics Nausea Only  . Vicodin [Hydrocodone-Acetaminophen] Other (See Comments)    Upset stomach    Social History:  The patient  reports that she has never smoked. She does not have any smokeless tobacco history on file. She reports that she drinks alcohol. She reports that she does not use illicit drugs.   ROS:  Please see the history of present illness.   No syncope, no orthopnea, no PND, chest pain    PHYSICAL EXAM: VS:  BP 118/60  Pulse 75  Ht 5' (1.524 m)  Wt 174 lb (78.926 kg)  BMI 33.98 kg/m2 Well nourished, well developed, in no acute distress HEENT: normal Neck: no JVD Cardiac:  Irregularly irregular, normal rate; no murmur Lungs:  clear to auscultation bilaterally, no  wheezing, rhonchi or rales Abd: soft, nontender, no hepatomegaly Ext: no edema Skin: warm and dry Neuro: no focal abnormalities noted  EKG: 12/08/13-atrial fibrillation heart rate 75, left anterior fascicular block, poor R wave progression   ASSESSMENT AND PLAN:  1. Atrial fibrillation-persistent, permanent, rate controlled.  2. Chronic anticoagulation-continue with Xarelto 20 mg. Creat 1.1. No bleeding. 3. Obesity-excellent job with weight loss.  4. Hypertension-excellent control. If blood pressure becomes too low, we may need to pull back medication. First it may be ACE inhibitor and HCTZ. 5. BUN - mild increase. Reviewed labs from Dr. Tamala Julian. Hydrating.  6. Dyspnea-multifactorial. Improved with weight loss however. 7. 6 month f/u.  Signed, Candee Furbish, MD [facility]  12/08/2013 12:37 PM

## 2014-02-01 ENCOUNTER — Other Ambulatory Visit: Payer: Self-pay | Admitting: Otolaryngology

## 2014-02-01 DIAGNOSIS — D497 Neoplasm of unspecified behavior of endocrine glands and other parts of nervous system: Secondary | ICD-10-CM

## 2014-02-01 DIAGNOSIS — Z8639 Personal history of other endocrine, nutritional and metabolic disease: Secondary | ICD-10-CM

## 2014-02-03 ENCOUNTER — Ambulatory Visit
Admission: RE | Admit: 2014-02-03 | Discharge: 2014-02-03 | Disposition: A | Discharge status: Home / Self Care | Payer: Medicare Other | Source: Ambulatory Visit | Attending: Otolaryngology | Admitting: Otolaryngology

## 2014-02-03 DIAGNOSIS — Z8639 Personal history of other endocrine, nutritional and metabolic disease: Secondary | ICD-10-CM

## 2014-02-03 DIAGNOSIS — D497 Neoplasm of unspecified behavior of endocrine glands and other parts of nervous system: Secondary | ICD-10-CM

## 2014-03-29 ENCOUNTER — Other Ambulatory Visit: Payer: Self-pay | Admitting: Cardiology

## 2014-04-13 ENCOUNTER — Other Ambulatory Visit: Payer: Self-pay

## 2014-04-13 DIAGNOSIS — Z1231 Encounter for screening mammogram for malignant neoplasm of breast: Secondary | ICD-10-CM

## 2014-05-12 ENCOUNTER — Ambulatory Visit
Admission: RE | Admit: 2014-05-12 | Discharge: 2014-05-12 | Disposition: A | Discharge status: Home / Self Care | Payer: Medicare Other | Source: Ambulatory Visit

## 2014-05-12 DIAGNOSIS — Z1231 Encounter for screening mammogram for malignant neoplasm of breast: Secondary | ICD-10-CM

## 2014-06-10 ENCOUNTER — Ambulatory Visit: Payer: Medicare Other | Admitting: Cardiology

## 2014-07-08 ENCOUNTER — Encounter: Payer: Self-pay | Admitting: Cardiology

## 2014-07-08 ENCOUNTER — Ambulatory Visit (INDEPENDENT_AMBULATORY_CARE_PROVIDER_SITE_OTHER): Payer: Medicare Other | Admitting: Cardiology

## 2014-07-08 VITALS — BP 118/68 | HR 78 | Ht 60.0 in | Wt 175.0 lb

## 2014-07-08 DIAGNOSIS — Z79899 Other long term (current) drug therapy: Secondary | ICD-10-CM

## 2014-07-08 DIAGNOSIS — I4891 Unspecified atrial fibrillation: Secondary | ICD-10-CM

## 2014-07-08 LAB — BASIC METABOLIC PANEL
BUN: 45 mg/dL — ABNORMAL HIGH (ref 6–23)
CALCIUM: 9.1 mg/dL (ref 8.4–10.5)
CO2: 24 meq/L (ref 19–32)
Chloride: 106 mEq/L (ref 96–112)
Creatinine, Ser: 1.4 mg/dL — ABNORMAL HIGH (ref 0.4–1.2)
GFR: 40.41 mL/min — ABNORMAL LOW (ref >60.00)
Glucose, Bld: 85 mg/dL (ref 70–99)
Potassium: 4.2 mEq/L (ref 3.5–5.1)
SODIUM: 137 meq/L (ref 135–145)

## 2014-07-08 LAB — CBC
HEMATOCRIT: 40.8 % (ref 36.0–46.0)
Hemoglobin: 13.3 g/dL (ref 12.0–15.0)
MCHC: 32.5 g/dL (ref 30.0–36.0)
MCV: 86.5 fl (ref 78.0–100.0)
Platelets: 176 10*3/uL (ref 150.0–400.0)
RBC: 4.72 Mil/uL (ref 3.87–5.11)
RDW: 15.2 % (ref 11.5–15.5)
WBC: 7.1 10*3/uL (ref 4.0–10.5)

## 2014-07-08 NOTE — Patient Instructions (Signed)
The current medical regimen is effective;  continue present plan and medications.  Please have blood work today (CBC,BMP)  Follow up in 6 months with Dr. Marlou Porch.  You will receive a letter in the mail 2 months before you are due.  Please call us when you receive this letter to schedule your follow up appointment.

## 2014-07-08 NOTE — Progress Notes (Signed)
[facility]. [address], [address] [place], [place]  [address] Phone: [phone] Fax:  [phone]  Date:  07/08/2014   ID:  Connie Daniel, DOB [birth year], MRN [id number]  PCP:  Reginia Naas, MD   History of Present Illness: Connie Daniel is a 73 y.o. female with atrial fibrillation surrounding gallbladder surgery post TEE cardioversion with echocardiogram showing mild LVH, mild mitral valve prolapse and mild mitral regurgitation, nuclear stress test in 2009 showing overall low risk here for followup.  She has lost approximately 30 pounds on Atkins diet. Excellent. She is doing well. Her blood pressure was a little bit on the low side previously in clinic. 100/60. No symptoms.  Doing well. No bleeding, no syncope. She is still feeling some shortness of breath when walking up a hill.   Wt Readings from Last 3 Encounters:  07/08/14 175 lb (79.379 kg)  12/08/13 174 lb (78.926 kg)  06/11/13 171 lb (77.565 kg)     Past Medical History  Diagnosis Date  . Dyspnea   . Aortic valve sclerosis   . Fatigue   . Hyperlipidemia   . Depression   . Hypertension   . Back pain   . Obesity   . Atrial fibrillation   . Esophageal reflux   . Allergic rhinitis   . Anxiety state, unspecified   . IBS (irritable bowel syndrome)   . Insomnia   . Graves disease     , with opthalmolopathy  . Hyperthyroidism   . Breast cancer     s/p chemo    Past Surgical History  Procedure Laterality Date  . Eye surgery Bilateral     Due to Grave's disease  . Breast lumpectomy Bilateral   . Cholecystectomy  12/29/2009    Current Outpatient Prescriptions  Medication Sig Dispense Refill  . alendronate (FOSAMAX) 70 MG tablet Take 70 mg by mouth once a week. Take with a full glass of water on an empty stomach.    Marland Kitchen atorvastatin (LIPITOR) 80 MG tablet   0  . buPROPion (WELLBUTRIN XL) 150 MG 24 hr tablet Take 150 mg by mouth daily.    . Calcium Carb-Cholecalciferol (CALCIUM + D3) 600-200 MG-UNIT TABS  Take 2 tablets by mouth daily.    . cholestyramine (QUESTRAN) 4 GM/DOSE powder Take one scoop twice a day as needed    . esomeprazole (NEXIUM) 40 MG capsule Take 40 mg by mouth daily as needed.    . fexofenadine (ALLEGRA) 180 MG tablet Take 180 mg by mouth daily as needed for allergies or rhinitis.    . fluticasone (FLONASE) 50 MCG/ACT nasal spray Place 1-2 sprays into both nostrils daily.    . hydrochlorothiazide (HYDRODIURIL) 25 MG tablet Take 25 mg by mouth daily.    Marland Kitchen HYDROcodone-acetaminophen (NORCO) 10-325 MG per tablet   0  . lisinopril (PRINIVIL,ZESTRIL) 5 MG tablet Take 5 mg by mouth daily.    . metoprolol succinate (TOPROL-XL) 50 MG 24 hr tablet Take 50 mg by mouth daily. Take with or immediately following a meal.    . oxymetazoline (AFRIN) 0.05 % nasal spray Place 1 spray into both nostrils 2 (two) times daily as needed for congestion.    . pentosan polysulfate (ELMIRON) 100 MG capsule Take 100 mg by mouth as needed.    . rivaroxaban (XARELTO) 20 MG TABS tablet Take 1 tablet (20 mg total) by mouth daily with supper. 30 tablet 6  . sertraline (ZOLOFT) 100 MG tablet Take 100 mg  by mouth daily.    . Vitamin D, Ergocalciferol, (DRISDOL) 50000 UNITS CAPS capsule Take 50,000 Units by mouth every 7 (seven) days.    Marland Kitchen zolpidem (AMBIEN) 10 MG tablet Take 10 mg by mouth at bedtime.     No current facility-administered medications for this visit.    Allergies:    Allergies  Allergen Reactions  . Sulfa Antibiotics Nausea Only  . Vicodin [Hydrocodone-Acetaminophen] Other (See Comments)    Upset stomach    Social History:  The patient  reports that she has never smoked. She does not have any smokeless tobacco history on file. She reports that she drinks alcohol. She reports that she does not use illicit drugs.   ROS:  Please see the history of present illness.   No syncope, no orthopnea, no PND, chest pain    PHYSICAL EXAM: VS:  BP 118/68 mmHg  Pulse 78  Ht 5' (1.524 m)  Wt 175 lb  (79.379 kg)  BMI 34.18 kg/m2 Well nourished, well developed, in no acute distress HEENT: normal Neck: no JVD Cardiac:  Irregularly irregular, normal rate; no murmur Lungs:  clear to auscultation bilaterally, no wheezing, rhonchi or rales Abd: soft, nontender, no hepatomegaly Ext: no edema Skin: warm and dry Neuro: no focal abnormalities noted  EKG: 12/08/13-atrial fibrillation heart rate 75, left anterior fascicular block, poor R wave progression   ASSESSMENT AND PLAN:  1. Atrial fibrillation-persistent, permanent, rate controlled.  2. Chronic anticoagulation-continue with Xarelto 20 mg. Creat 1.1. No bleeding. 3. Obesity-excellent job with weight loss.  4. Hypertension-excellent control. If blood pressure becomes too low, we may need to pull back medication. First it may be ACE inhibitor and HCTZ. 5. Dyspnea-multifactorial. Improved with weight loss however. 6. 6 month f/u. Checking lab work. Basic metabolic profile and CBC since she is on Xarelto.  Signed, Candee Furbish, MD [facility]  07/08/2014 2:44 PM

## 2014-11-20 ENCOUNTER — Other Ambulatory Visit: Payer: Self-pay | Admitting: Cardiology

## 2015-01-26 ENCOUNTER — Telehealth: Payer: Self-pay | Admitting: Cardiology

## 2015-01-26 NOTE — Telephone Encounter (Signed)
OK to take extra half of metoprolol.  Symptoms should improve as prednisone is tapered Candee Furbish, MD

## 2015-01-26 NOTE — Telephone Encounter (Signed)
Follow up    Pt called to find out if this office had heard from Dr. Marlou Porch. As of yet.  Pt will call back 7/1.

## 2015-01-26 NOTE — Telephone Encounter (Signed)
Returned patient call.  Told her that Dr. Marlou Porch said it was ok to take an extra half of metoprolol and her symptoms should improve as prednisone is tapered.  Patient repeated back instructions and does not have further questions.

## 2015-01-26 NOTE — Telephone Encounter (Signed)
°  Pt c/o medication issue:  1. Name of Medication: Prednisone  2. How are you currently taking this medication (dosage and times per day)? 20 mg BID  3. Are you having a reaction (difficulty breathing--STAT)? Coughing, congestion, shakiness, dizziness  4. What is your medication issue? Pt was started on 2 rounds of Prednisone for a previous infection 6/1. Last Friday pt completed her last course of prednisone. However, symptoms listed above are still ongoing. She is requesting to speak to a nurse.

## 2015-01-26 NOTE — Telephone Encounter (Signed)
Left message for patient to call back  

## 2015-01-26 NOTE — Telephone Encounter (Signed)
Pt went to see PCP yesterday as she finished her second round (20 mg BID) of Prednisone on Friday 6/24 and since  she is having shaky and dizzy spells, and previously had been told that was a side effect of the medication. During her time pt stated her HR was 100 but they had her walking up and down the halls to evaluate her SOB on exertions and BP 120/80.  Pt was told by the provider that it could be fluid even though she has not signs of swelling so he was going to check a BNP, pt has not gotten the results back but was going to call their office to get an update.  Pt was told by the provider to take and extra half of Toprol-XL and see if makes her feel better. The provider told her that if it is fluid she could have heart failure and could die and this scared her as she stated she had not heard of any of this before from Connie Daniel.  Pt stated they did not do an EKG as she has a history of a.fib and the PCP wanted to wait on doing that until after BNP results.  Pt is reluctatent to take and extra Toprol and wants to know what Connie Daniel thinks before taking.  Told pt I would forward to Connie Daniel and our office will back when we have an answer.  Pt called to get an appt as she was due for 1 year f/u with [facility] as she has been so sick she has not come for an appt yet. Pt was told earliest was Sept. So she set up an appt with Connie Daniel for August, wants to know if she should be seen sooner?

## 2015-01-27 ENCOUNTER — Other Ambulatory Visit: Payer: Self-pay | Admitting: Otolaryngology

## 2015-01-27 DIAGNOSIS — D497 Neoplasm of unspecified behavior of endocrine glands and other parts of nervous system: Secondary | ICD-10-CM

## 2015-02-17 ENCOUNTER — Telehealth (HOSPITAL_COMMUNITY): Payer: Self-pay | Admitting: *Deleted

## 2015-02-20 ENCOUNTER — Other Ambulatory Visit (HOSPITAL_COMMUNITY): Payer: Self-pay | Admitting: Family Medicine

## 2015-02-20 DIAGNOSIS — R0602 Shortness of breath: Secondary | ICD-10-CM

## 2015-02-21 ENCOUNTER — Ambulatory Visit (HOSPITAL_COMMUNITY)
Admission: RE | Admit: 2015-02-21 | Discharge: 2015-02-21 | Disposition: A | Discharge status: Home / Self Care | Payer: Medicare Other | Source: Ambulatory Visit | Attending: Cardiology | Admitting: Cardiology

## 2015-02-21 DIAGNOSIS — Z8249 Family history of ischemic heart disease and other diseases of the circulatory system: Secondary | ICD-10-CM | POA: Insufficient documentation

## 2015-02-21 DIAGNOSIS — I340 Nonrheumatic mitral (valve) insufficiency: Secondary | ICD-10-CM | POA: Diagnosis not present

## 2015-02-21 DIAGNOSIS — R0602 Shortness of breath: Secondary | ICD-10-CM

## 2015-02-21 DIAGNOSIS — E785 Hyperlipidemia, unspecified: Secondary | ICD-10-CM | POA: Insufficient documentation

## 2015-02-21 DIAGNOSIS — I10 Essential (primary) hypertension: Secondary | ICD-10-CM | POA: Diagnosis not present

## 2015-02-21 DIAGNOSIS — I059 Rheumatic mitral valve disease, unspecified: Secondary | ICD-10-CM | POA: Insufficient documentation

## 2015-02-22 ENCOUNTER — Telehealth: Payer: Self-pay | Admitting: Physician Assistant

## 2015-02-22 NOTE — Telephone Encounter (Signed)
Received records from [place] for appointment on 02/24/15 with Melina Copa, PA at [facility].  Records given to [employer] (medical records) for Dayna's schedule on 02/24/15. lp

## 2015-02-24 ENCOUNTER — Encounter: Payer: Self-pay | Admitting: Physician Assistant

## 2015-02-24 ENCOUNTER — Ambulatory Visit (INDEPENDENT_AMBULATORY_CARE_PROVIDER_SITE_OTHER): Payer: Medicare Other | Admitting: Physician Assistant

## 2015-02-24 VITALS — BP 112/60 | HR 77 | Ht 60.0 in | Wt 179.0 lb

## 2015-02-24 DIAGNOSIS — I10 Essential (primary) hypertension: Secondary | ICD-10-CM | POA: Diagnosis not present

## 2015-02-24 DIAGNOSIS — E785 Hyperlipidemia, unspecified: Secondary | ICD-10-CM | POA: Diagnosis not present

## 2015-02-24 DIAGNOSIS — R0602 Shortness of breath: Secondary | ICD-10-CM

## 2015-02-24 DIAGNOSIS — E669 Obesity, unspecified: Secondary | ICD-10-CM

## 2015-02-24 DIAGNOSIS — E0590 Thyrotoxicosis, unspecified without thyrotoxic crisis or storm: Secondary | ICD-10-CM

## 2015-02-24 DIAGNOSIS — I480 Paroxysmal atrial fibrillation: Secondary | ICD-10-CM

## 2015-02-24 DIAGNOSIS — I4819 Other persistent atrial fibrillation: Secondary | ICD-10-CM

## 2015-02-24 DIAGNOSIS — I481 Persistent atrial fibrillation: Secondary | ICD-10-CM | POA: Diagnosis not present

## 2015-02-24 NOTE — Patient Instructions (Signed)
Your physician has requested that you have a lexiscan myoview. For further information please visit [email or web address]. Please follow instruction sheet, as given.  Continue all medications as prescribed.     Your physician recommends that you schedule a follow-up appointment in: 3 months with Dr. Marlou Porch.

## 2015-02-24 NOTE — Progress Notes (Addendum)
Cardiology Office Note Date:  02/24/2015  Patient ID:  Connie Daniel, [place] 09-20-1940, MRN [id number] PCP:  Reginia Naas, MD  Cardiologist:  Dr. Marlou Porch    Chief Complaint: atrial fib, SOB  History of Present Illness: BIRGIT Daniel is a 74 y.o. female with history of persistent/likely permanent atrial fib, HTN, HLD, obesity, esophageal reflux, depression, breast CA s/p chemo, hyperthyroidism who presents for evaluation of recurrent AF. She has history of AF surrounding gallbladder surgery requiring TEE/DCCV and has been on Xarelto. At last OV in 11/2013 she was in atrial fib which was rate controlled and felt to be permanent. She has reported history of low risk nuc in 2009 however decreased sensitivity due to breast attenuation and bowel loop attenuation.  In May 2016 she developed an episode of bronchitis and coughing. She was prescribed prednisone but it did not improve. She was prescribed another round of prednisone and said things went downhill from there. She developed shakiness, dizziness, SOB, and persistent nausea. She saw several providers at her PCP office throughout this. She was noted to be in afib and metoprolol was increased to 75mg  daily. (I am not sure the provider that saw her was privy to the information that she has likely permanent atrial fib.) Her BNP was mildly elevated (149 then 236) thus she had a repeat 2D echo 02/21/15 showing EF 55-60%, no RWMA, mild MR, mod LAE, mildly dilated RA. The patient reports she was significantly distressed by the news of the elevated BNP. She had a very fatalistic view of the conversation she had regarding this elevated level. She said she was worried it meant she didn't have much time left. She subsequently scheduled a follow-up appointment here.  She reports she is slowly beginning to finally feel better. The SOB has improved from rest. However, she does report dyspnea on exertion over the last year, which occurs when doing brisk  activities or walking up hills. She reports continued fatigue.  She denies any LEE, orthopnea, PND, or chest pain. She lost 5 lbs during her episode of bronchitis then gained 5lbs back. She reports she has been stress-eating because she was really worried about the BNP issue. Says "I eat every time I walk past the refrigerator."  Recent labs from PCP:  01/16/15: WBC 10.1, Hgb 14.5, Hcg 45.1, Plt 209. Na 139, K 4.3, Cl 105, CO2 27, calcium 9.6, Tprot 6.9, Albumin 4.1, Tbili 1.1, ALP 75, AST 21, ALT 20, glucose 88, vitamin D 32. Tchol 156, trig 91, HDL 60, LDL 78. 01/25/15 TSH 0.59 free T4 0.93.  BNP 149 on 6/29 and 236 on 02/06/15.  Past Medical History  Diagnosis Date  . Hyperlipidemia   . Depression   . Hypertension   . Back pain   . Obesity   . PAF (paroxysmal atrial fibrillation)     a. s/p TEE/DCCV in 2011 surrounding GB surgery.  . Esophageal reflux   . Allergic rhinitis   . Anxiety state, unspecified   . IBS (irritable bowel syndrome)   . Insomnia   . Graves disease     , with opthalmolopathy  . Hyperthyroidism   . Breast cancer     s/p chemo    Past Surgical History  Procedure Laterality Date  . Eye surgery Bilateral     Due to Grave's disease  . Breast lumpectomy Bilateral   . Cholecystectomy  12/29/2009    Current Outpatient Prescriptions  Medication Sig Dispense Refill  . alendronate (FOSAMAX) 70 MG tablet  Take 70 mg by mouth once a week. Take with a full glass of water on an empty stomach.    Marland Kitchen atorvastatin (LIPITOR) 80 MG tablet   0  . buPROPion (WELLBUTRIN XL) 150 MG 24 hr tablet Take 150 mg by mouth daily.    . Calcium Carb-Cholecalciferol (CALCIUM + D3) 600-200 MG-UNIT TABS Take 2 tablets by mouth daily.    . cholestyramine (QUESTRAN) 4 GM/DOSE powder Take one scoop twice a day as needed    . esomeprazole (NEXIUM) 40 MG capsule Take 40 mg by mouth daily as needed.    . fexofenadine (ALLEGRA) 180 MG tablet Take 180 mg by mouth daily as needed for allergies or  rhinitis.    . fluticasone (FLONASE) 50 MCG/ACT nasal spray Place 1-2 sprays into both nostrils daily.    . hydrochlorothiazide (HYDRODIURIL) 25 MG tablet Take 25 mg by mouth daily.    Marland Kitchen HYDROcodone-acetaminophen (NORCO) 10-325 MG per tablet   0  . lisinopril (PRINIVIL,ZESTRIL) 5 MG tablet Take 5 mg by mouth daily.    . metoprolol succinate (TOPROL-XL) 50 MG 24 hr tablet Take 75 mg by mouth daily. Take with or immediately following a meal.    . oxymetazoline (AFRIN) 0.05 % nasal spray Place 1 spray into both nostrils 2 (two) times daily as needed for congestion.    . pentosan polysulfate (ELMIRON) 100 MG capsule Take 100 mg by mouth as needed.    . sertraline (ZOLOFT) 100 MG tablet Take 100 mg by mouth daily.    . Vitamin D, Ergocalciferol, (DRISDOL) 50000 UNITS CAPS capsule Take 50,000 Units by mouth every 7 (seven) days.    Alveda Reasons 20 MG TABS tablet TAKE 1 TABLET ONCE DAILY WITH SUPPER. 30 tablet 3  . zolpidem (AMBIEN) 10 MG tablet Take 10 mg by mouth at bedtime.     No current facility-administered medications for this visit.    Allergies:   Sulfa antibiotics and Vicodin   Social History:  The patient  reports that she has never smoked. She does not have any smokeless tobacco history on file. She reports that she drinks alcohol. She reports that she does not use illicit drugs.   Family History:  The patient's family history includes Heart attack in her father; Heart failure in her mother; Hyperlipidemia in her mother; Hypertension in her mother; Sudden death in her father.  ROS:  Please see the history of present illness. No bleeding. All other systems are reviewed and otherwise negative.    PHYSICAL EXAM:  VS:  BP 112/60 mmHg  Pulse 77  Ht 5' (1.524 m)  Wt 179 lb (81.194 kg)  BMI 34.96 kg/m2 BMI: Body mass index is 34.96 kg/(m^2). Well nourished, well developed WF in no acute distress HEENT: normocephalic, atraumatic Neck: no JVD, carotid bruits or masses Cardiac:  normal S1,  S2; irregularly irregular; no murmurs, rubs, or gallops Lungs:  clear to auscultation bilaterally, no wheezing, rhonchi or rales Abd: soft, nontender, no hepatomegaly, + BS MS: no deformity or atrophy Ext: no edema Skin: warm and dry, no rash Neuro:  moves all extremities spontaneously, no focal abnormalities noted, follows commands Psych: euthymic mood, full affect   EKG:  Done today shows atrial fib 77bpm, low voltage QRS, possible prior inferior infarct, cannot r/o prior anterior infarct, poor R wave progression, no sig change from prior  Recent Labs: 07/08/2014: BUN 45*; Creatinine, Ser 1.4*; Hemoglobin 13.3; Platelets 176.0; Potassium 4.2; Sodium 137  No results found for requested labs within  last 365 days.   CrCl cannot be calculated (Patient has no serum creatinine result on file.).   Wt Readings from Last 3 Encounters:  02/24/15 179 lb (81.194 kg)  07/08/14 175 lb (79.379 kg)  12/08/13 174 lb (78.926 kg)     Other studies reviewed: Additional studies/records reviewed today include: summarized above  ASSESSMENT AND PLAN:  1. Dyspnea - it sounds like many of her symptoms were related to intolerance of prednisone for recent bronchitis. Her BNP was mildly elevated but this may have been up in the setting of elevated HR from prednisone +/- fluid retention. Echo is benign and the patient has no evidence of CHF on exam today. Heart rate is controlled. She is on HCTZ daily. She is, however, describing vague symptoms of fatigue and dyspnea on exertion. Given cardiac risk factors I think it's prudent to proceed with an updated nuclear stress test. I chose a Lexiscan study because she would have to hold her rate controlling agents for an exercise study. If this is negative would consider sending her for a sleep study given her fatigue. 2. Persistent AF - rate controlled. Continue higher dose of metoprolol. Continue Xarelto.  3. Essential HTN - controlled. 4. Hyperlipidemia - continue  statin. 5. Obesity  -Body mass index is 34.96 kg/(m^2). She says she is actually planning to join a gym. We discussed gradual increase of activity as tolerated. I congratulated her on this decision. I asked her to keep track of her weight and let us know if it continues to trend up rather than down. 6. H/o hyperthyroidism - recent labs OK.  Disposition: F/u with Dr. Marlou Porch in 3 months.  Current medicines are reviewed at length with the patient today.  The patient did not have any concerns regarding medicines.  Raechel Ache PA-C 02/24/2015 4:43 PM     [facility] [address], [place], [place] [address] Phone: [phone]

## 2015-02-27 ENCOUNTER — Encounter: Payer: Self-pay | Admitting: Cardiology

## 2015-03-02 ENCOUNTER — Telehealth (HOSPITAL_COMMUNITY): Payer: Self-pay

## 2015-03-02 NOTE — Telephone Encounter (Signed)
Encounter complete. 

## 2015-03-03 ENCOUNTER — Telehealth (HOSPITAL_COMMUNITY): Payer: Self-pay

## 2015-03-03 ENCOUNTER — Ambulatory Visit: Payer: Self-pay | Admitting: Physician Assistant

## 2015-03-03 NOTE — Telephone Encounter (Signed)
Encounter complete. 

## 2015-03-06 ENCOUNTER — Telehealth: Payer: Self-pay | Admitting: Cardiology

## 2015-03-06 NOTE — Telephone Encounter (Signed)
Patient called returning Connie Daniel's call.  Has lexiscan scheduled for tomorrow.  Needs to know about meds, etc.

## 2015-03-06 NOTE — Telephone Encounter (Signed)
Recommendations for lexiscan test given. Instructed to take meds as usual - no recommendations to go off antiarrythmics per Dayna's note. Instructed to w/hold caffeine 24 hrs prior and to fast 2 hrs prior. Pt voiced understanding. No further concerns, questions answered to her satisfaction.

## 2015-03-07 ENCOUNTER — Ambulatory Visit (HOSPITAL_COMMUNITY)
Admission: RE | Admit: 2015-03-07 | Discharge: 2015-03-07 | Disposition: A | Discharge status: Home / Self Care | Payer: Medicare Other | Source: Ambulatory Visit | Attending: Urology | Admitting: Urology

## 2015-03-07 DIAGNOSIS — I4819 Other persistent atrial fibrillation: Secondary | ICD-10-CM

## 2015-03-07 DIAGNOSIS — I10 Essential (primary) hypertension: Secondary | ICD-10-CM | POA: Diagnosis not present

## 2015-03-07 DIAGNOSIS — R9439 Abnormal result of other cardiovascular function study: Secondary | ICD-10-CM | POA: Diagnosis not present

## 2015-03-07 DIAGNOSIS — I481 Persistent atrial fibrillation: Secondary | ICD-10-CM

## 2015-03-07 DIAGNOSIS — R0602 Shortness of breath: Secondary | ICD-10-CM | POA: Diagnosis not present

## 2015-03-07 DIAGNOSIS — I4891 Unspecified atrial fibrillation: Secondary | ICD-10-CM | POA: Diagnosis not present

## 2015-03-07 LAB — MYOCARDIAL PERFUSION IMAGING
CHL CUP RESTING HR STRESS: 61 {beats}/min
CSEPPHR: 76 {beats}/min
LVDIAVOL: 80 mL
LVSYSVOL: 26 mL
SDS: 8
SRS: 4
SSS: 12
TID: 1.3

## 2015-03-07 MED ORDER — TECHNETIUM TC 99M SESTAMIBI GENERIC - CARDIOLITE
30.1000 | Freq: Once | INTRAVENOUS | Status: AC | PRN
  Administered 2015-03-07: 30.1 via INTRAVENOUS

## 2015-03-07 MED ORDER — AMINOPHYLLINE 25 MG/ML IV SOLN
75.0000 mg | Freq: Once | INTRAVENOUS | Status: AC
  Administered 2015-03-07: 75 mg via INTRAVENOUS

## 2015-03-07 MED ORDER — TECHNETIUM TC 99M SESTAMIBI GENERIC - CARDIOLITE
10.5000 | Freq: Once | INTRAVENOUS | Status: AC | PRN
  Administered 2015-03-07: 11 via INTRAVENOUS

## 2015-03-07 MED ORDER — REGADENOSON 0.4 MG/5ML IV SOLN
0.4000 mg | Freq: Once | INTRAVENOUS | Status: AC
  Administered 2015-03-07: 0.4 mg via INTRAVENOUS

## 2015-03-08 ENCOUNTER — Ambulatory Visit: Payer: Self-pay | Admitting: Cardiology

## 2015-03-09 ENCOUNTER — Telehealth: Payer: Self-pay | Admitting: Cardiology

## 2015-03-09 DIAGNOSIS — R9439 Abnormal result of other cardiovascular function study: Secondary | ICD-10-CM

## 2015-03-09 DIAGNOSIS — Z01812 Encounter for preprocedural laboratory examination: Secondary | ICD-10-CM

## 2015-03-09 NOTE — Telephone Encounter (Signed)
Spoke with pt at length about the results of her testing and need for cardiac cath.  All questions were answered.  The pt has been scheduled for Monday 8/15 however she requested to re-schedule as she is unsure of whether she can get someone to take her or not.  She will work on that and is aware I will discuss another date with Dr Marlou Porch tomorrow.  She would want he to do the procedure.  She is aware I will call her back tomorrow and that she will need lab work prior to the procedure.  I also reviewed all instructions with her but will give her a written copy when she comes in for blood work.

## 2015-03-09 NOTE — Telephone Encounter (Signed)
New message  ° ° °Patient calling for test results.   °

## 2015-03-10 ENCOUNTER — Encounter: Payer: Self-pay | Admitting: *Deleted

## 2015-03-10 NOTE — Telephone Encounter (Signed)
Pt aware date and time of procedure (Aug 29th at [facility]).  She will come in for lab Aug 24th.  Aware of instructions and that an instruction sheet will be mailed to her home address.

## 2015-03-10 NOTE — Telephone Encounter (Signed)
Follow up     Pt is wanting to know if procedure can be done on 8-29 instead of 8-15. Please call to discuss

## 2015-03-22 ENCOUNTER — Other Ambulatory Visit (INDEPENDENT_AMBULATORY_CARE_PROVIDER_SITE_OTHER): Payer: Medicare Other | Admitting: *Deleted

## 2015-03-22 DIAGNOSIS — R9439 Abnormal result of other cardiovascular function study: Secondary | ICD-10-CM

## 2015-03-22 DIAGNOSIS — Z01812 Encounter for preprocedural laboratory examination: Secondary | ICD-10-CM | POA: Diagnosis not present

## 2015-03-22 LAB — BASIC METABOLIC PANEL
BUN: 36 mg/dL — AB (ref 6–23)
CO2: 27 mEq/L (ref 19–32)
Calcium: 9.2 mg/dL (ref 8.4–10.5)
Chloride: 105 mEq/L (ref 96–112)
Creatinine, Ser: 1.26 mg/dL — ABNORMAL HIGH (ref 0.40–1.20)
GFR: 44.05 mL/min — ABNORMAL LOW (ref >60.00)
Glucose, Bld: 90 mg/dL (ref 70–99)
POTASSIUM: 3.9 meq/L (ref 3.5–5.1)
SODIUM: 141 meq/L (ref 135–145)

## 2015-03-22 LAB — PROTIME-INR
INR: 2.4 ratio — AB (ref 0.8–1.0)
Prothrombin Time: 25.9 s — ABNORMAL HIGH (ref 9.6–13.1)

## 2015-03-22 LAB — CBC
HCT: 36.2 % (ref 36.0–46.0)
Hemoglobin: 12.3 g/dL (ref 12.0–15.0)
MCHC: 33.9 g/dL (ref 30.0–36.0)
MCV: 84.5 fl (ref 78.0–100.0)
PLATELETS: 145 10*3/uL — AB (ref 150.0–400.0)
RBC: 4.28 Mil/uL (ref 3.87–5.11)
RDW: 15.8 % — ABNORMAL HIGH (ref 11.5–15.5)
WBC: 6.3 10*3/uL (ref 4.0–10.5)

## 2015-03-24 ENCOUNTER — Telehealth: Payer: Self-pay | Admitting: Cardiology

## 2015-03-24 NOTE — Telephone Encounter (Signed)
Left message to call back to discuss questions.

## 2015-03-24 NOTE — Telephone Encounter (Signed)
New message     Pt is scheduled for heart cath Pt has a couple questions regarding the procedure

## 2015-03-24 NOTE — Telephone Encounter (Signed)
Spoke with pt who was concerned that her instruction sheet stated to make sure she took her ASA and she is not on ASA.  Advised the instruction sheet is a general form and if she didn't take ASA regularly, not to worry about it.  She also wanted to know it she will need someone to stay with her for longer than a week.  Advised pt she just needed someone to drive her home and stay with her for the first 24 hours as long as her cath is normal.  If abnormal results - the length of time she would need someone would depend on the type of treatment needed.  Pt states understanding.

## 2015-03-27 ENCOUNTER — Ambulatory Visit (HOSPITAL_COMMUNITY)
Admission: RE | Admit: 2015-03-27 | Discharge: 2015-03-27 | Disposition: A | Discharge status: Home / Self Care | Payer: Medicare Other | Source: Ambulatory Visit | Attending: Cardiology | Admitting: Cardiology

## 2015-03-27 ENCOUNTER — Encounter (HOSPITAL_COMMUNITY): Payer: Self-pay | Admitting: Cardiology

## 2015-03-27 ENCOUNTER — Other Ambulatory Visit: Payer: Self-pay | Admitting: Cardiology

## 2015-03-27 ENCOUNTER — Encounter (HOSPITAL_COMMUNITY)
Admission: RE | Disposition: A | Discharge status: Home / Self Care | Payer: Self-pay | Source: Ambulatory Visit | Attending: Cardiology

## 2015-03-27 DIAGNOSIS — K589 Irritable bowel syndrome without diarrhea: Secondary | ICD-10-CM | POA: Diagnosis not present

## 2015-03-27 DIAGNOSIS — I480 Paroxysmal atrial fibrillation: Secondary | ICD-10-CM | POA: Diagnosis not present

## 2015-03-27 DIAGNOSIS — Z853 Personal history of malignant neoplasm of breast: Secondary | ICD-10-CM | POA: Insufficient documentation

## 2015-03-27 DIAGNOSIS — Z7951 Long term (current) use of inhaled steroids: Secondary | ICD-10-CM | POA: Insufficient documentation

## 2015-03-27 DIAGNOSIS — F329 Major depressive disorder, single episode, unspecified: Secondary | ICD-10-CM | POA: Insufficient documentation

## 2015-03-27 DIAGNOSIS — I10 Essential (primary) hypertension: Secondary | ICD-10-CM | POA: Diagnosis not present

## 2015-03-27 DIAGNOSIS — E669 Obesity, unspecified: Secondary | ICD-10-CM | POA: Insufficient documentation

## 2015-03-27 DIAGNOSIS — E0500 Thyrotoxicosis with diffuse goiter without thyrotoxic crisis or storm: Secondary | ICD-10-CM | POA: Diagnosis not present

## 2015-03-27 DIAGNOSIS — Z6834 Body mass index (BMI) 34.0-34.9, adult: Secondary | ICD-10-CM | POA: Diagnosis not present

## 2015-03-27 DIAGNOSIS — R0600 Dyspnea, unspecified: Secondary | ICD-10-CM | POA: Insufficient documentation

## 2015-03-27 DIAGNOSIS — Z9221 Personal history of antineoplastic chemotherapy: Secondary | ICD-10-CM | POA: Insufficient documentation

## 2015-03-27 DIAGNOSIS — J309 Allergic rhinitis, unspecified: Secondary | ICD-10-CM | POA: Insufficient documentation

## 2015-03-27 DIAGNOSIS — Z7902 Long term (current) use of antithrombotics/antiplatelets: Secondary | ICD-10-CM | POA: Diagnosis not present

## 2015-03-27 DIAGNOSIS — E785 Hyperlipidemia, unspecified: Secondary | ICD-10-CM | POA: Diagnosis not present

## 2015-03-27 DIAGNOSIS — K219 Gastro-esophageal reflux disease without esophagitis: Secondary | ICD-10-CM | POA: Diagnosis not present

## 2015-03-27 DIAGNOSIS — F419 Anxiety disorder, unspecified: Secondary | ICD-10-CM | POA: Insufficient documentation

## 2015-03-27 DIAGNOSIS — R9439 Abnormal result of other cardiovascular function study: Secondary | ICD-10-CM

## 2015-03-27 HISTORY — PX: CARDIAC CATHETERIZATION: SHX172

## 2015-03-27 LAB — PROTIME-INR
INR: 1.21 (ref 0.00–1.49)
Prothrombin Time: 15.5 seconds — ABNORMAL HIGH (ref 11.6–15.2)

## 2015-03-27 SURGERY — LEFT HEART CATH AND CORONARY ANGIOGRAPHY
Anesthesia: LOCAL

## 2015-03-27 MED ORDER — LIDOCAINE HCL (PF) 1 % IJ SOLN
INTRAMUSCULAR | Status: AC
  Filled 2015-03-27: qty 30

## 2015-03-27 MED ORDER — FENTANYL CITRATE (PF) 100 MCG/2ML IJ SOLN
INTRAMUSCULAR | Status: DC | PRN
  Administered 2015-03-27 (×2): 25 ug via INTRAVENOUS

## 2015-03-27 MED ORDER — ASPIRIN 81 MG PO CHEW
CHEWABLE_TABLET | ORAL | Status: AC
  Administered 2015-03-27: 81 mg via ORAL
  Filled 2015-03-27: qty 1

## 2015-03-27 MED ORDER — VERAPAMIL HCL 2.5 MG/ML IV SOLN
INTRAVENOUS | Status: DC | PRN
  Administered 2015-03-27: 09:00:00 via INTRA_ARTERIAL

## 2015-03-27 MED ORDER — SODIUM CHLORIDE 0.9 % IV SOLN: 250.0000 mL | INTRAVENOUS | Status: DC | PRN

## 2015-03-27 MED ORDER — VERAPAMIL HCL 2.5 MG/ML IV SOLN
INTRAVENOUS | Status: AC
  Filled 2015-03-27: qty 2

## 2015-03-27 MED ORDER — SODIUM CHLORIDE 0.9 % IJ SOLN: 3.0000 mL | Freq: Two times a day (BID) | INTRAMUSCULAR | Status: DC

## 2015-03-27 MED ORDER — SODIUM CHLORIDE 0.9 % IJ SOLN: 3.0000 mL | INTRAMUSCULAR | Status: DC | PRN

## 2015-03-27 MED ORDER — HEPARIN SODIUM (PORCINE) 1000 UNIT/ML IJ SOLN
INTRAMUSCULAR | Status: AC
  Filled 2015-03-27: qty 1

## 2015-03-27 MED ORDER — MIDAZOLAM HCL 2 MG/2ML IJ SOLN
INTRAMUSCULAR | Status: AC
  Filled 2015-03-27: qty 4

## 2015-03-27 MED ORDER — ASPIRIN 81 MG PO CHEW
81.0000 mg | CHEWABLE_TABLET | ORAL | Status: AC
  Administered 2015-03-27: 81 mg via ORAL

## 2015-03-27 MED ORDER — LIDOCAINE HCL (PF) 1 % IJ SOLN
INTRAMUSCULAR | Status: DC | PRN
  Administered 2015-03-27: 10:00:00

## 2015-03-27 MED ORDER — HEPARIN (PORCINE) IN NACL 2-0.9 UNIT/ML-% IJ SOLN
INTRAMUSCULAR | Status: AC
  Filled 2015-03-27: qty 1000

## 2015-03-27 MED ORDER — SODIUM CHLORIDE 0.9 % WEIGHT BASED INFUSION: 3.0000 mL/kg/h | INTRAVENOUS | Status: DC

## 2015-03-27 MED ORDER — SODIUM CHLORIDE 0.9 % WEIGHT BASED INFUSION: 1.0000 mL/kg/h | INTRAVENOUS | Status: DC

## 2015-03-27 MED ORDER — FENTANYL CITRATE (PF) 100 MCG/2ML IJ SOLN
INTRAMUSCULAR | Status: AC
  Filled 2015-03-27: qty 4

## 2015-03-27 MED ORDER — MIDAZOLAM HCL 2 MG/2ML IJ SOLN
INTRAMUSCULAR | Status: DC | PRN
  Administered 2015-03-27: 2 mg via INTRAVENOUS
  Administered 2015-03-27 (×2): 1 mg via INTRAVENOUS

## 2015-03-27 MED ORDER — SODIUM CHLORIDE 0.9 % WEIGHT BASED INFUSION
3.0000 mL/kg/h | INTRAVENOUS | Status: AC
  Administered 2015-03-27: 3 mL/kg/h via INTRAVENOUS

## 2015-03-27 MED ORDER — HEPARIN SODIUM (PORCINE) 1000 UNIT/ML IJ SOLN
INTRAMUSCULAR | Status: DC | PRN
  Administered 2015-03-27: 3500 [IU] via INTRAVENOUS

## 2015-03-27 SURGICAL SUPPLY — 12 items

## 2015-03-27 NOTE — Discharge Instructions (Signed)
Radial Site Care °Refer to this sheet in the next few weeks. These instructions provide you with information on caring for yourself after your procedure. Your caregiver may also give you more specific instructions. Your treatment has been planned according to current medical practices, but problems sometimes occur. Call your caregiver if you have any problems or questions after your procedure. °HOME CARE INSTRUCTIONS °· You may shower the day after the procedure. Remove the bandage (dressing) and gently wash the site with plain soap and water. Gently pat the site dry. °· Do not apply powder or lotion to the site. °· Do not submerge the affected site in water for 3 to 5 days. °· Inspect the site at least twice daily. °· Do not flex or bend the affected arm for 24 hours. °· No lifting over 5 pounds (2.3 kg) for 5 days after your procedure. °· Do not drive home if you are discharged the same day of the procedure. Have someone else drive you. °· You may drive 24 hours after the procedure unless otherwise instructed by your caregiver. °· Do not operate machinery or power tools for 24 hours. °· A responsible adult should be with you for the first 24 hours after you arrive home. °What to expect: °· Any bruising will usually fade within 1 to 2 weeks. °· Blood that collects in the tissue (hematoma) may be painful to the touch. It should usually decrease in size and tenderness within 1 to 2 weeks. °SEEK IMMEDIATE MEDICAL CARE IF: °· You have unusual pain at the radial site. °· You have redness, warmth, swelling, or pain at the radial site. °· You have drainage (other than a small amount of blood on the dressing). °· You have chills. °· You have a fever or persistent symptoms for more than 72 hours. °· You have a fever and your symptoms suddenly get worse. °· Your arm becomes pale, cool, tingly, or numb. °· You have heavy bleeding from the site. Hold pressure on the site. °Document Released: 08/17/2010 Document Revised:  10/07/2011 Document Reviewed: 08/17/2010 °ExitCare® Patient Information ©2015 [employer]. This information is not intended to replace advice given to you by your health care provider. Make sure you discuss any questions you have with your health care provider. ° °

## 2015-03-27 NOTE — Interval H&P Note (Signed)
History and Physical Interval Note:  03/27/2015 8:36 AM  Connie Daniel  has presented today for surgery, with the diagnosis of abnormal stress test, dyspnea   The various methods of treatment have been discussed with the patient and family. After consideration of risks, benefits and other options for treatment, the patient has consented to  Procedure(s): Left Heart Cath and Coronary Angiography (N/A) as a surgical intervention .  The patient's history has been reviewed, patient examined, no change in status, stable for surgery.  I have reviewed the patient's chart and labs.  Questions were answered to the patient's satisfaction.     SKAINS, MARK

## 2015-03-27 NOTE — H&P (Signed)
Office visit from 02/24/15.    Expand All Collapse All      Cardiology Office Note Date: 02/24/2015  Patient ID: Connie, Daniel [birth year], MRN [id number] PCP: Reginia Naas, MD Cardiologist: Dr. Marlou Porch   Chief Complaint: atrial fib, SOB  History of Present Illness: Connie Daniel is a 74 y.o. female with history of persistent/likely permanent atrial fib, HTN, HLD, obesity, esophageal reflux, depression, breast CA s/p chemo, hyperthyroidism who presents for evaluation of recurrent AF. She has history of AF surrounding gallbladder surgery requiring TEE/DCCV and has been on Xarelto. At last OV in 11/2013 she was in atrial fib which was rate controlled and felt to be permanent. She has reported history of low risk nuc in 2009 however decreased sensitivity due to breast attenuation and bowel loop attenuation.  In May 2016 she developed an episode of bronchitis and coughing. She was prescribed prednisone but it did not improve. She was prescribed another round of prednisone and said things went downhill from there. She developed shakiness, dizziness, SOB, and persistent nausea. She saw several providers at her PCP office throughout this. She was noted to be in afib and metoprolol was increased to 75mg  daily. (I am not sure the provider that saw her was privy to the information that she has likely permanent atrial fib.) Her BNP was mildly elevated (149 then 236) thus she had a repeat 2D echo 02/21/15 showing EF 55-60%, no RWMA, mild MR, mod LAE, mildly dilated RA. The patient reports she was significantly distressed by the news of the elevated BNP. She had a very fatalistic view of the conversation she had regarding this elevated level. She said she was worried it meant she didn't have much time left. She subsequently scheduled a follow-up appointment here.  She reports she is slowly beginning to finally feel better. The SOB has improved from rest. However, she does report dyspnea  on exertion over the last year, which occurs when doing brisk activities or walking up hills. She reports continued fatigue. She denies any LEE, orthopnea, PND, or chest pain. She lost 5 lbs during her episode of bronchitis then gained 5lbs back. She reports she has been stress-eating because she was really worried about the BNP issue. Says "I eat every time I walk past the refrigerator."  Recent labs from PCP:  01/16/15: WBC 10.1, Hgb 14.5, Hcg 45.1, Plt 209. Na 139, K 4.3, Cl 105, CO2 27, calcium 9.6, Tprot 6.9, Albumin 4.1, Tbili 1.1, ALP 75, AST 21, ALT 20, glucose 88, vitamin D 32. Tchol 156, trig 91, HDL 60, LDL 78. 01/25/15 TSH 0.59 free T4 0.93.  BNP 149 on 6/29 and 236 on 02/06/15.  Past Medical History  Diagnosis Date  . Hyperlipidemia   . Depression   . Hypertension   . Back pain   . Obesity   . PAF (paroxysmal atrial fibrillation)     a. s/p TEE/DCCV in 2011 surrounding GB surgery.  . Esophageal reflux   . Allergic rhinitis   . Anxiety state, unspecified   . IBS (irritable bowel syndrome)   . Insomnia   . Graves disease     , with opthalmolopathy  . Hyperthyroidism   . Breast cancer     s/p chemo    Past Surgical History  Procedure Laterality Date  . Eye surgery Bilateral     Due to Grave's disease  . Breast lumpectomy Bilateral   . Cholecystectomy  12/29/2009    Current Outpatient Prescriptions  Medication Sig Dispense  Refill  . alendronate (FOSAMAX) 70 MG tablet Take 70 mg by mouth once a week. Take with a full glass of water on an empty stomach.    Marland Kitchen atorvastatin (LIPITOR) 80 MG tablet   0  . buPROPion (WELLBUTRIN XL) 150 MG 24 hr tablet Take 150 mg by mouth daily.    . Calcium Carb-Cholecalciferol (CALCIUM + D3) 600-200 MG-UNIT TABS Take 2 tablets by mouth daily.    . cholestyramine (QUESTRAN) 4 GM/DOSE powder Take one scoop twice a day as needed    .  esomeprazole (NEXIUM) 40 MG capsule Take 40 mg by mouth daily as needed.    . fexofenadine (ALLEGRA) 180 MG tablet Take 180 mg by mouth daily as needed for allergies or rhinitis.    . fluticasone (FLONASE) 50 MCG/ACT nasal spray Place 1-2 sprays into both nostrils daily.    . hydrochlorothiazide (HYDRODIURIL) 25 MG tablet Take 25 mg by mouth daily.    Marland Kitchen HYDROcodone-acetaminophen (NORCO) 10-325 MG per tablet   0  . lisinopril (PRINIVIL,ZESTRIL) 5 MG tablet Take 5 mg by mouth daily.    . metoprolol succinate (TOPROL-XL) 50 MG 24 hr tablet Take 75 mg by mouth daily. Take with or immediately following a meal.    . oxymetazoline (AFRIN) 0.05 % nasal spray Place 1 spray into both nostrils 2 (two) times daily as needed for congestion.    . pentosan polysulfate (ELMIRON) 100 MG capsule Take 100 mg by mouth as needed.    . sertraline (ZOLOFT) 100 MG tablet Take 100 mg by mouth daily.    . Vitamin D, Ergocalciferol, (DRISDOL) 50000 UNITS CAPS capsule Take 50,000 Units by mouth every 7 (seven) days.    Alveda Reasons 20 MG TABS tablet TAKE 1 TABLET ONCE DAILY WITH SUPPER. 30 tablet 3  . zolpidem (AMBIEN) 10 MG tablet Take 10 mg by mouth at bedtime.     No current facility-administered medications for this visit.    Allergies: Sulfa antibiotics and Vicodin   Social History: The patient  reports that she has never smoked. She does not have any smokeless tobacco history on file. She reports that she drinks alcohol. She reports that she does not use illicit drugs.   Family History: The patient's family history includes Heart attack in her father; Heart failure in her mother; Hyperlipidemia in her mother; Hypertension in her mother; Sudden death in her father.  ROS: Please see the history of present illness. No bleeding. All other systems are reviewed and otherwise negative.   PHYSICAL EXAM:  VS: BP 112/60 mmHg  Pulse 77  Ht 5' (1.524  m)  Wt 179 lb (81.194 kg)  BMI 34.96 kg/m2 BMI: Body mass index is 34.96 kg/(m^2). Well nourished, well developed WF in no acute distress  HEENT: normocephalic, atraumatic  Neck: no JVD, carotid bruits or masses Cardiac: normal S1, S2; irregularly irregular; no murmurs, rubs, or gallops Lungs: clear to auscultation bilaterally, no wheezing, rhonchi or rales  Abd: soft, nontender, no hepatomegaly, + BS MS: no deformity or atrophy Ext: no edema  Skin: warm and dry, no rash Neuro: moves all extremities spontaneously, no focal abnormalities noted, follows commands Psych: euthymic mood, full affect   EKG: Done today shows atrial fib 77bpm, low voltage QRS, possible prior inferior infarct, cannot r/o prior anterior infarct, poor R wave progression, no sig change from prior  Recent Labs: 07/08/2014: BUN 45*; Creatinine, Ser 1.4*; Hemoglobin 13.3; Platelets 176.0; Potassium 4.2; Sodium 137  No results found for requested labs  within last 365 days.   CrCl cannot be calculated (Patient has no serum creatinine result on file.).   Wt Readings from Last 3 Encounters:  02/24/15 179 lb (81.194 kg)  07/08/14 175 lb (79.379 kg)  12/08/13 174 lb (78.926 kg)     Other studies reviewed: Additional studies/records reviewed today include: summarized above  ASSESSMENT AND PLAN:  1. Dyspnea - it sounds like many of her symptoms were related to intolerance of prednisone for recent bronchitis. Her BNP was mildly elevated but this may have been up in the setting of elevated HR from prednisone +/- fluid retention. Echo is benign and the patient has no evidence of CHF on exam today. Heart rate is controlled. She is on HCTZ daily. She is, however, describing vague symptoms of fatigue and dyspnea on exertion. Given cardiac risk factors I think it's prudent to proceed with an updated nuclear stress test. I chose a Lexiscan study because she would have to hold her rate controlling agents for an  exercise study. If this is negative would consider sending her for a sleep study given her fatigue. 2. Persistent AF - rate controlled. Continue higher dose of metoprolol. Continue Xarelto.  3. Essential HTN - controlled. 4. Hyperlipidemia - continue statin. 5. Obesity -Body mass index is 34.96 kg/(m^2). She says she is actually planning to join a gym. We discussed gradual increase of activity as tolerated. I congratulated her on this decision. I asked her to keep track of her weight and let us know if it continues to trend up rather than down. 6. H/o hyperthyroidism - recent labs OK.  Disposition: F/u with Dr. Marlou Porch in 3 months.  Current medicines are reviewed at length with the patient today. The patient did not have any concerns regarding medicines.  Raechel Ache PA-C 02/24/2015 4:43 PM   [facility] [address] [place], [place] [address] Phone: [phone]

## 2015-04-07 ENCOUNTER — Ambulatory Visit: Payer: Self-pay | Admitting: Cardiology

## 2015-05-23 ENCOUNTER — Ambulatory Visit (INDEPENDENT_AMBULATORY_CARE_PROVIDER_SITE_OTHER): Payer: Medicare Other | Admitting: Cardiology

## 2015-05-23 ENCOUNTER — Encounter: Payer: Self-pay | Admitting: Cardiology

## 2015-05-23 VITALS — BP 110/60 | HR 95 | Ht 60.0 in | Wt 184.0 lb

## 2015-05-23 DIAGNOSIS — I10 Essential (primary) hypertension: Secondary | ICD-10-CM

## 2015-05-23 DIAGNOSIS — I481 Persistent atrial fibrillation: Secondary | ICD-10-CM | POA: Diagnosis not present

## 2015-05-23 DIAGNOSIS — Z7901 Long term (current) use of anticoagulants: Secondary | ICD-10-CM

## 2015-05-23 DIAGNOSIS — I4819 Other persistent atrial fibrillation: Secondary | ICD-10-CM

## 2015-05-23 DIAGNOSIS — R0602 Shortness of breath: Secondary | ICD-10-CM | POA: Insufficient documentation

## 2015-05-23 NOTE — Progress Notes (Signed)
[place]. [address] [place], [place]  [address] Phone: [phone] Fax:  [phone]  Date:  05/23/2015   ID:  Connie Daniel, DOB 09-Sep-1940, MRN [id number]  PCP:  Reginia Naas, MD   History of Present Illness: Connie Daniel is a 74 y.o. female with atrial fibrillation surrounding gallbladder surgery post TEE cardioversion with echocardiogram showing mild LVH, mild mitral valve prolapse and mild mitral regurgitation, nuclear stress test in 2009 showing overall low risk here for followup.  She has lost approximately 30 pounds on Atkins diet. Excellent. She is doing well. Her blood pressure was a little bit on the low side previously in clinic. 100/60. No symptoms.  Doing well. No bleeding, no syncope. She is still feeling some shortness of breath when walking up a hill.  Bronchitis - prednisone, two rounds. Still quite short of breath.   Wt Readings from Last 3 Encounters:  05/23/15 184 lb (83.462 kg)  03/27/15 169 lb (76.658 kg)  03/07/15 179 lb (81.194 kg)     Past Medical History  Diagnosis Date  . Hyperlipidemia   . Depression   . Hypertension   . Back pain   . Obesity   . PAF (paroxysmal atrial fibrillation) ([place])     a. s/p TEE/DCCV in 2011 surrounding GB surgery.  . Esophageal reflux   . Allergic rhinitis   . Anxiety state, unspecified   . IBS (irritable bowel syndrome)   . Insomnia   . Graves disease     , with opthalmolopathy  . Hyperthyroidism   . Breast cancer [facility])     s/p chemo    Past Surgical History  Procedure Laterality Date  . Eye surgery Bilateral     Due to Grave's disease  . Breast lumpectomy Bilateral   . Cholecystectomy  12/29/2009  . Cardiac catheterization N/A 03/27/2015    Procedure: Left Heart Cath and Coronary Angiography;  Surgeon: Jerline Pain, MD;  Location: [facility];  Service: Cardiovascular;  Laterality: N/A;    Current Outpatient Prescriptions  Medication Sig Dispense Refill  . atorvastatin  (LIPITOR) 80 MG tablet Take 80 mg by mouth daily at 6 PM.   0  . buPROPion (WELLBUTRIN XL) 150 MG 24 hr tablet Take 150 mg by mouth daily.    . Calcium Carb-Cholecalciferol (CALCIUM + D3) 600-200 MG-UNIT TABS Take 2 tablets by mouth daily.    . cholestyramine (QUESTRAN) 4 GM/DOSE powder Take 4 g by mouth 2 (two) times daily with a meal. For IBS    . denosumab (PROLIA) 60 MG/ML SOLN injection Inject 60 mg into the skin every 6 (six) months. Administer in upper arm, thigh, or abdomen    . esomeprazole (NEXIUM) 40 MG capsule Take 40 mg by mouth daily as needed (acid reflux).     . fexofenadine (ALLEGRA) 180 MG tablet Take 180 mg by mouth daily as needed for allergies or rhinitis.    . fluticasone (FLONASE) 50 MCG/ACT nasal spray Place 1-2 sprays into both nostrils daily as needed.     . hydrochlorothiazide (HYDRODIURIL) 25 MG tablet Take 25 mg by mouth daily.    Marland Kitchen HYDROcodone-acetaminophen (NORCO) 10-325 MG per tablet Take 0.5-1 tablets by mouth daily as needed for moderate pain.   0  . lisinopril (PRINIVIL,ZESTRIL) 5 MG tablet Take 5 mg by mouth daily.    . metoprolol succinate (TOPROL-XL) 50 MG 24 hr tablet Take 75 mg by mouth daily. Take with or immediately following  a meal.    . rivaroxaban (XARELTO) 20 MG TABS tablet Take 1 tablet (20 mg total) by mouth daily with supper. 30 tablet 3  . sertraline (ZOLOFT) 100 MG tablet Take 100 mg by mouth at bedtime.     . Vitamin D, Ergocalciferol, (DRISDOL) 50000 UNITS CAPS capsule Take 50,000 Units by mouth every 7 (seven) days. Wednesday    . zolpidem (AMBIEN) 10 MG tablet Take 10 mg by mouth at bedtime.     No current facility-administered medications for this visit.    Allergies:    Allergies  Allergen Reactions  . Sulfa Antibiotics Nausea Only  . Vicodin [Hydrocodone-Acetaminophen] Other (See Comments)    Upset stomach    Social History:  The patient  reports that she has never smoked. She does not have any smokeless tobacco history on file.  She reports that she drinks alcohol. She reports that she does not use illicit drugs.   ROS:  Please see the history of present illness.   No syncope, no orthopnea, no PND, chest pain    PHYSICAL EXAM: VS:  BP 110/60 mmHg  Pulse 95  Ht 5' (1.524 m)  Wt 184 lb (83.462 kg)  BMI 35.94 kg/m2  SpO2 97% Well nourished, well developed, in no acute distress HEENT: normal Neck: no JVD Cardiac:  Irregularly irregular, normal rate; no murmur Lungs:  clear to auscultation bilaterally, no wheezing, rhonchi or rales Abd: soft, nontender, no hepatomegaly Ext: no edema Skin: warm and dry Neuro: no focal abnormalities noted  EKG: 12/08/13-atrial fibrillation heart rate 75, left anterior fascicular block, poor R wave progression   Cardiac catheterization 03/27/15:  The left ventricular systolic function is normal. Her left ventricular end-diastolic pressure was 10 mmHg, normal correlating to a normal wedge pressure.  Normal coronary arteries  No aortic stenosis  Reassuring catheterization (false positive NUC stress test)   ASSESSMENT AND PLAN:  1. Atrial fibrillation-persistent, permanent, rate controlled.  2. Chronic anticoagulation-continue with Xarelto 20 mg. Creat 1.1. No bleeding. 3. Obesity-excellent job with weight loss but she fortunately has gained back some weight especially after her prednisone treatments.  4. Hypertension-excellent control. If blood pressure becomes too low, we may need to pull back medication. First it may be ACE inhibitor and HCTZ. 5. Dyspnea-multifactorial. Improved with weight loss however previously but she is still quite short of breath at times. She is concerned that her BNP may be elevated. We will check. Her cardiac catheterization was reassuring showing no CAD, normal left ventricular end-diastolic pressure or wedge pressure. Her echo shows normal left ventricular systolic function. No evidence of significant diastolic dysfunction. I will go ahead and refer  her to pulmonary medicine for further evaluation. 6. 6 month f/u.   Signed, Candee Furbish, MD [facility]  05/23/2015 3:14 PM

## 2015-05-23 NOTE — Patient Instructions (Signed)
Medication Instructions:  The current medical regimen is effective;  continue present plan and medications.  You have been referred to Pulmonary for the evaluation of shortness of breath.  Labwork: Please have blood work today (BNP)  Follow-Up: Follow up in 6 months with Dr. Marlou Porch.  You will receive a letter in the mail 2 months before you are due.  Please call us when you receive this letter to schedule your follow up appointment.  If you need a refill on your cardiac medications before your next appointment, please call your pharmacy.  Thank you for choosing [facility]!!

## 2015-05-24 LAB — BRAIN NATRIURETIC PEPTIDE: BRAIN NATRIURETIC PEPTIDE: 177.8 pg/mL — AB (ref 0.0–100.0)

## 2015-05-26 ENCOUNTER — Telehealth: Payer: Self-pay | Admitting: Cardiology

## 2015-05-26 MED ORDER — FUROSEMIDE 20 MG PO TABS: 20.0000 mg | ORAL_TABLET | Freq: Every day | ORAL | Status: DC

## 2015-05-26 NOTE — Telephone Encounter (Signed)
New Message   Pt states she is returning call and she is waiting for test results

## 2015-05-26 NOTE — Telephone Encounter (Signed)
-----   Message from Jerline Pain, MD sent at 05/25/2015  6:50 AM EDT ----- Mildly elevated BNP. Despite normal LVEDP, lets stop HCTZ and start lasix 20mg  PO QD. Perhaps a loop diuretic will help with symptoms. If no response, (weight decrease or increase in urination), will increase dose of lasix. In one week have her check BMET.  Candee Furbish, MD

## 2015-05-26 NOTE — Telephone Encounter (Signed)
Advised patient of results and scheduled for follow up labs Patient will call if no response to Lasix

## 2015-06-02 ENCOUNTER — Other Ambulatory Visit: Payer: Medicare Other

## 2015-06-05 ENCOUNTER — Ambulatory Visit: Payer: Medicare Other | Admitting: Cardiology

## 2015-06-05 ENCOUNTER — Other Ambulatory Visit (INDEPENDENT_AMBULATORY_CARE_PROVIDER_SITE_OTHER): Payer: Medicare Other | Admitting: *Deleted

## 2015-06-05 DIAGNOSIS — I10 Essential (primary) hypertension: Secondary | ICD-10-CM

## 2015-06-05 LAB — BASIC METABOLIC PANEL
BUN: 31 mg/dL — AB (ref 7–25)
CHLORIDE: 103 mmol/L (ref 98–110)
CO2: 26 mmol/L (ref 20–31)
CREATININE: 1.25 mg/dL — AB (ref 0.60–0.93)
Calcium: 8.7 mg/dL (ref 8.6–10.4)
GLUCOSE: 89 mg/dL (ref 65–99)
Potassium: 4.2 mmol/L (ref 3.5–5.3)
Sodium: 140 mmol/L (ref 135–146)

## 2015-06-07 ENCOUNTER — Other Ambulatory Visit: Payer: Self-pay

## 2015-06-07 DIAGNOSIS — Z1231 Encounter for screening mammogram for malignant neoplasm of breast: Secondary | ICD-10-CM

## 2015-06-12 ENCOUNTER — Other Ambulatory Visit (INDEPENDENT_AMBULATORY_CARE_PROVIDER_SITE_OTHER): Payer: Medicare Other

## 2015-06-12 ENCOUNTER — Ambulatory Visit (INDEPENDENT_AMBULATORY_CARE_PROVIDER_SITE_OTHER)
Admission: RE | Admit: 2015-06-12 | Discharge: 2015-06-12 | Disposition: A | Discharge status: Home / Self Care | Payer: Medicare Other | Source: Ambulatory Visit | Attending: Internal Medicine | Admitting: Internal Medicine

## 2015-06-12 ENCOUNTER — Ambulatory Visit (INDEPENDENT_AMBULATORY_CARE_PROVIDER_SITE_OTHER): Payer: Medicare Other | Admitting: Internal Medicine

## 2015-06-12 ENCOUNTER — Encounter: Payer: Self-pay | Admitting: Internal Medicine

## 2015-06-12 VITALS — BP 130/74 | HR 84 | Ht 60.0 in | Wt 183.2 lb

## 2015-06-12 DIAGNOSIS — R0600 Dyspnea, unspecified: Secondary | ICD-10-CM | POA: Diagnosis not present

## 2015-06-12 LAB — BASIC METABOLIC PANEL
BUN: 29 mg/dL — AB (ref 6–23)
CHLORIDE: 105 meq/L (ref 96–112)
CO2: 29 meq/L (ref 19–32)
CREATININE: 1.12 mg/dL (ref 0.40–1.20)
Calcium: 9.5 mg/dL (ref 8.4–10.5)
GFR: 50.43 mL/min — ABNORMAL LOW (ref >60.00)
GLUCOSE: 95 mg/dL (ref 70–99)
Potassium: 4.1 mEq/L (ref 3.5–5.1)
Sodium: 142 mEq/L (ref 135–145)

## 2015-06-12 LAB — CBC WITH DIFFERENTIAL/PLATELET
BASOS PCT: 0.9 % (ref 0.0–3.0)
Basophils Absolute: 0 10*3/uL (ref 0.0–0.1)
EOS PCT: 2 % (ref 0.0–5.0)
Eosinophils Absolute: 0.1 10*3/uL (ref 0.0–0.7)
HCT: 37.9 % (ref 36.0–46.0)
HEMOGLOBIN: 12.5 g/dL (ref 12.0–15.0)
Lymphocytes Relative: 22.3 % (ref 12.0–46.0)
Lymphs Abs: 1.2 10*3/uL (ref 0.7–4.0)
MCHC: 32.9 g/dL (ref 30.0–36.0)
MCV: 83.6 fl (ref 78.0–100.0)
MONO ABS: 0.5 10*3/uL (ref 0.1–1.0)
Monocytes Relative: 8.9 % (ref 3.0–12.0)
Neutro Abs: 3.6 10*3/uL (ref 1.4–7.7)
Neutrophils Relative %: 65.9 % (ref 43.0–77.0)
Platelets: 167 10*3/uL (ref 150.0–400.0)
RBC: 4.53 Mil/uL (ref 3.87–5.11)
RDW: 14.7 % (ref 11.5–15.5)
WBC: 5.4 10*3/uL (ref 4.0–10.5)

## 2015-06-12 LAB — BRAIN NATRIURETIC PEPTIDE: Pro B Natriuretic peptide (BNP): 190 pg/mL — ABNORMAL HIGH (ref 0.0–100.0)

## 2015-06-12 LAB — TSH: TSH: 1.06 u[IU]/mL (ref 0.35–4.50)

## 2015-06-12 NOTE — Assessment & Plan Note (Addendum)
-   03/27/15 [facility]  LVEDP 15  06/12/2015  Walked RA x 3 laps @ 185 ft each stopped due to sob with nl sats/ fast pace    Symptoms are markedly disproportionate to objective findings and not clear this is a lung problem but pt does appear to have difficult airway management issues. DDX of  difficult airways management all start with A and  include Adherence, Ace Inhibitors, Acid Reflux, Active Sinus Disease, Alpha 1 Antitripsin deficiency, Anxiety masquerading as Airways dz,  ABPA,  allergy(esp in young), Aspiration (esp in elderly), Adverse effects of meds,  Active smokers, A bunch of PE's (a small clot burden can't cause this syndrome unless there is already severe underlying pulm or vascular dz with poor reserve) plus two Bs  = Bronchiectasis and Beta blocker use..and one C= CHF  Adherence is always the initial "prime suspect" and is a multilayered concern that requires a "trust but verify" approach in every patient - starting with knowing how to use medications, especially inhalers, correctly, keeping up with refills and understanding the fundamental difference between maintenance and prns vs those medications only taken for a very short course and then stopped and not refilled.  - note poor correlation between last avs from cards and what she actually takes  ? acei > not really clear when it was stopped but should remain off   ? Acid (or non-acid) GERD > always difficult to exclude as up to 75% of pts in some series report no assoc GI/ Heartburn symptoms> rec max (24h)  acid suppression and diet restrictions/ reviewed and instructions given in writing.   ? Anxiety related to wt gain from pred, deconditioning (she tries to walk much faster than her body habitus will allow)  ? A bunch of pe's > unlikely as already on eliquis   ? BB blocker effect > a concern should higher lopressor rx be required   ? chf > agree with Dr Marlou Porch this is Marlowe Aschoff   I had an extended discussion with the patient  reviewing all relevant studies completed to date and  lasting 35 minutes of a 60  minute visit    Each maintenance medication was reviewed in detail including most importantly the difference between maintenance and prns and under what circumstances the prns are to be triggered using an action plan format that is not reflected in the computer generated alphabetically organized AVS.    Please see instructions for details which were reviewed in writing and the patient given a copy highlighting the part that I personally wrote and discussed at today's ov.   Will need to return for full pfts on max gerd rx/ diet and discuss cpst then if dx remains in doubt

## 2015-06-12 NOTE — Assessment & Plan Note (Signed)
Complicated by hbp  Body mass index is 35.78 kg/(m^2).  Lab Results  Component Value Date   TSH 1.06 06/12/2015     Contributing to gerd tendency/ doe/reviewed the need and the process to achieve and maintain neg calorie balance > defer f/u primary care including intermittently monitoring thyroid status

## 2015-06-12 NOTE — Progress Notes (Addendum)
Subjective:    Patient ID: Connie Daniel, female    DOB: [birth year],    MRN: [id number]  HPI  67 yowf never smoker with severe cough in July 2016 then sob with cough better p 2 rounds of prednisone but not the breathing assoc with about 14 lb wt gain with eval by cardiology > no explanation for sob per Dr Marlou Porch > referred to pulmonary clinic 06/12/2015     06/12/2015 1st [facility] office visit/ Connie Daniel   Chief Complaint  Patient presents with  . Pulmonary Consult    Referred by Dr. Marlou Porch.  Pt c/o SOB since Aug 2016.  Pt states that she gets SOB walking approx 20 ft on flat surface and walking up any incline.   onset sob was with cough which was really severe at onset ("the worst ever "? While on ACEi) but mostly cleared then noted  sob pattern variable - sometimes walks fine other times only 20 ft. Not clear when ACEi stopped as still on Dr Kingsley Plan list from ov 05/23/15 and pt absolutely sure she's stopped it but not sure when or why as was not instructed to do so by Dr Marlou Porch Never sob at rest, never had stress test  No obvious other patterns in day to day or daytime variabilty or assoc ongoing significant chronic cough or cp or chest tightness, subjective wheeze overt sinus or hb symptoms. No unusual exp hx or h/o childhood pna/ asthma or knowledge of premature birth.  Sleeping ok without nocturnal  or early am exacerbation  of respiratory  c/o's or need for noct saba. Also denies any obvious fluctuation of symptoms with weather or environmental changes or other aggravating or alleviating factors except as outlined above   Current Medications, Allergies, Complete Past Medical History, Past Surgical History, Family History, and Social History were reviewed in [employer] record.              Review of Systems  Constitutional: Negative for fever, chills and unexpected weight change.  HENT: Negative for congestion, dental problem, ear pain, nosebleeds,  postnasal drip, rhinorrhea, sinus pressure, sneezing, sore throat, trouble swallowing and voice change.   Eyes: Negative for visual disturbance.  Respiratory: Positive for shortness of breath. Negative for cough and choking.   Cardiovascular: Negative for chest pain and leg swelling.  Gastrointestinal: Negative for vomiting, abdominal pain and diarrhea.  Genitourinary: Negative for difficulty urinating.  Musculoskeletal: Negative for arthralgias.  Skin: Negative for rash.  Neurological: Negative for tremors, syncope and headaches.  Hematological: Does not bruise/bleed easily.       Objective:   Physical Exam  Hoarse wf nad  Wt Readings from Last 3 Encounters:  06/12/15 183 lb 3.2 oz (83.099 kg)  05/23/15 184 lb (83.462 kg)  03/27/15 169 lb (76.658 kg)    Vital signs reviewed  HEENT: nl dentition, turbinates, and oropharynx. Nl external ear canals without cough reflex   NECK :  without JVD/Nodes/TM/ nl carotid upstrokes bilaterally   LUNGS: no acc muscle use, clear to A and P bilaterally without cough on insp or exp maneuvers   CV:  RRR  no s3 or murmur or increase in P2, no edema   ABD:  soft and nontender with nl excursion in the supine position. No bruits or organomegaly, bowel sounds nl  MS:  warm without deformities, calf tenderness, cyanosis or clubbing  SKIN: warm and dry without lesions    NEURO:  alert, approp, no deficits  CXR PA and Lateral:   06/12/2015 :    I personally reviewed images and agree with radiology impression as follows:   wnl    Labs ordered/ reviewed:    Lab 06/12/15 1611  NA 142  K 4.1  CL 105  CO2 29  BUN 29*  CREATININE 1.12  GLUCOSE 95     Lab 06/12/15 1611  HGB 12.5  HCT 37.9  WBC 5.4  PLT 167.0     Lab Results  Component Value Date   TSH 1.06 06/12/2015     Lab Results  Component Value Date   PROBNP 190.0* 06/12/2015       Assessment & Plan:

## 2015-06-12 NOTE — Patient Instructions (Addendum)
Be sure that the med list we provided with these instructions matches up exactly with what you are taking at home and if not call us back right away with corrections  Automatically every day take Nexium  40 mg  X  30-60 min before first meal of the day and Pepcid (famotidine)  20 mg one @  bedtime until return to office - this is the best way to tell whether stomach acid is contributing to your problem.   GERD (REFLUX)  is an extremely common cause of respiratory symptoms just like yours , many times with no obvious heartburn at all.    It can be treated with medication, but also with lifestyle changes including elevation of the head of your bed (ideally with 6 inch  bed blocks),  Smoking cessation, avoidance of late meals, excessive alcohol, and avoid fatty foods, chocolate, peppermint, colas, red wine, and acidic juices such as orange juice.  NO MINT OR MENTHOL PRODUCTS SO NO COUGH DROPS  USE SUGARLESS CANDY INSTEAD (Jolley ranchers or Stover's or Life Savers) or even ice chips will also do - the key is to swallow to prevent all throat clearing. NO OIL BASED VITAMINS - use powdered substitutes.  Please remember to go to the lab and x-ray department downstairs for your tests - we will call you with the results when they are available.    Please schedule a follow up office visit in 6 weeks, call sooner if needed with pfts on return

## 2015-06-13 LAB — D-DIMER, QUANTITATIVE: D-Dimer, Quant: 0.52 ug/mL-FEU — ABNORMAL HIGH (ref 0.00–0.48)

## 2015-06-13 NOTE — Progress Notes (Signed)
Quick Note:  lmtcb for pt. ______ 

## 2015-06-20 ENCOUNTER — Encounter: Payer: Self-pay | Admitting: Family Medicine

## 2015-07-03 ENCOUNTER — Ambulatory Visit
Admission: RE | Admit: 2015-07-03 | Discharge: 2015-07-03 | Disposition: A | Discharge status: Home / Self Care | Payer: Medicare Other | Source: Ambulatory Visit

## 2015-07-03 DIAGNOSIS — Z1231 Encounter for screening mammogram for malignant neoplasm of breast: Secondary | ICD-10-CM

## 2015-07-25 ENCOUNTER — Ambulatory Visit (INDEPENDENT_AMBULATORY_CARE_PROVIDER_SITE_OTHER): Payer: Medicare Other | Admitting: Internal Medicine

## 2015-07-25 ENCOUNTER — Encounter: Payer: Self-pay | Admitting: Internal Medicine

## 2015-07-25 VITALS — BP 128/78 | HR 89 | Ht 60.0 in | Wt 186.6 lb

## 2015-07-25 DIAGNOSIS — R0600 Dyspnea, unspecified: Secondary | ICD-10-CM | POA: Diagnosis not present

## 2015-07-25 DIAGNOSIS — I10 Essential (primary) hypertension: Secondary | ICD-10-CM

## 2015-07-25 LAB — PULMONARY FUNCTION TEST
DL/VA % pred: 109 %
DL/VA: 4.75 ml/min/mmHg/L
DLCO UNC % PRED: 74 %
DLCO unc: 14.48 ml/min/mmHg
FEF 25-75 PRE: 1.51 L/s
FEF 25-75 Post: 1.14 L/sec
FEF2575-%CHANGE-POST: -24 %
FEF2575-%PRED-POST: 77 %
FEF2575-%PRED-PRE: 102 %
FEV1-%CHANGE-POST: -6 %
FEV1-%Pred-Post: 62 %
FEV1-%Pred-Pre: 66 %
FEV1-Post: 1.12 L
FEV1-Pre: 1.19 L
FEV1FVC-%Change-Post: 0 %
FEV1FVC-%PRED-PRE: 114 %
FEV6-%CHANGE-POST: -6 %
FEV6-%PRED-POST: 57 %
FEV6-%Pred-Pre: 61 %
FEV6-Post: 1.3 L
FEV6-Pre: 1.39 L
FEV6FVC-%Pred-Post: 105 %
FEV6FVC-%Pred-Pre: 105 %
FVC-%CHANGE-POST: -6 %
FVC-%PRED-POST: 54 %
FVC-%Pred-Pre: 57 %
FVC-Post: 1.3 L
FVC-Pre: 1.39 L
POST FEV1/FVC RATIO: 87 %
Post FEV6/FVC ratio: 100 %
Pre FEV1/FVC ratio: 86 %
Pre FEV6/FVC Ratio: 100 %
RV % pred: 49 %
RV: 1.05 L
TLC % pred: 72 %
TLC: 3.28 L

## 2015-07-25 MED ORDER — VALSARTAN 160 MG PO TABS: 160.0000 mg | ORAL_TABLET | Freq: Every day | ORAL | Status: DC

## 2015-07-25 NOTE — Progress Notes (Signed)
PFT done today. 07/25/2015

## 2015-07-25 NOTE — Patient Instructions (Addendum)
Stop lisinopril and start diovan 160 mg take  one half daily instead.  Stop nexium and just take pepcid after bfast and after supper for at least then taper down to one daily x 2 weeks and stop and see what if any symptoms flare.  Please schedule a follow up office visit in 6 weeks, call sooner if needed

## 2015-07-25 NOTE — Progress Notes (Signed)
Subjective:    Patient ID: Connie Daniel, female    DOB: 1940/10/19,    MRN: [id number]    .Brief patient profile:  41 yowf never smoker with severe cough in July 2016 then sob with cough better p 2 rounds of prednisone but not the breathing assoc with about 14 lb wt gain with eval by cardiology > no explanation for sob per Dr Marlou Porch > referred to pulmonary clinic 06/12/2015     06/12/2015 1st [facility] office visit/ Connie Daniel   Chief Complaint  Patient presents with  . Pulmonary Consult    Referred by Dr. Marlou Porch.  Pt c/o SOB since Aug 2016.  Pt states that she gets SOB walking approx 20 ft on flat surface and walking up any incline.   onset sob was with cough which was really severe at onset ("the worst ever "? While on ACEi) but mostly cleared then noted  sob pattern variable - sometimes walks fine other times only 20 ft. Not clear when ACEi stopped as still on Dr Kingsley Plan list from ov 05/23/15 and pt absolutely sure she's stopped it but not sure when or why as was not instructed to do so by Dr Marlou Porch Never sob at rest, never had stress test rec Be sure that the med list we provided with these instructions matches up exactly with what you are taking at home and if not call us back right away with corrections Automatically every day take Nexium  40 mg  X  30-60 min before first meal of the day and Pepcid (famotidine)  20 mg one @  bedtime until return to office - this is the best way to tell whether stomach acid is contributing to your problem.  GERD diet    07/25/2015  f/u ov/Connie Daniel re: cough/ wheeze/ sob still on ACEi! Chief Complaint  Patient presents with  . Follow-up    Pt c/o siunus congestion, dry cough, wheeze and SOB. Pt states that her breathing has not improved much since LOV.    only new problem is diarrhea since starting ppi  No obvious day to day or daytime variability to resp symptoms or assoc excess/ purulent sputum or mucus plugs  or cp or chest tightness,  or overt  HB symptoms. No unusual exp hx or h/o childhood pna/ asthma or knowledge of premature birth.  Sleeping ok without nocturnal  or early am exacerbation  of respiratory  c/o's or need for noct saba. Also denies any obvious fluctuation of symptoms with weather or environmental changes or other aggravating or alleviating factors except as outlined above   Current Medications, Allergies, Complete Past Medical History, Past Surgical History, Family History, and Social History were reviewed in [employer] record.  ROS  The following are not active complaints unless bolded sore throat, dysphagia, dental problems, itching, sneezing,  nasal congestion or excess/ purulent secretions, ear ache,   fever, chills, sweats, unintended wt loss, classically pleuritic or exertional cp, hemoptysis,  orthopnea pnd or leg swelling, presyncope, palpitations, abdominal pain, anorexia, nausea, vomiting, diarrhea  or change in bowel or bladder habits, change in stools or urine, dysuria,hematuria,  rash, arthralgias, visual complaints, headache, numbness, weakness or ataxia or problems with walking or coordination,  change in mood/affect or memory.               Objective:   Physical Exam  Hoarse wf nad  07/25/2015      186   06/12/15 183 lb 3.2 oz (83.099  kg)  05/23/15 184 lb (83.462 kg)  03/27/15 169 lb (76.658 kg)    Vital signs reviewed  HEENT: nl dentition, turbinates, and oropharynx. Nl external ear canals without cough reflex   NECK :  without JVD/Nodes/TM/ nl carotid upstrokes bilaterally   LUNGS: no acc muscle use, clear to A and P bilaterally without cough on insp or exp maneuvers   CV:  RRR  no s3 or murmur or increase in P2, no edema   ABD:  soft and nontender with nl excursion in the supine position. No bruits or organomegaly, bowel sounds nl  MS:  warm without deformities, calf tenderness, cyanosis or clubbing  SKIN: warm and dry without lesions    NEURO:  alert,  approp, no deficits   CXR PA and Lateral:   06/12/2015 :    I personally reviewed images and agree with radiology impression as follows:   wnl    Labs reviewed:  Lab 06/12/15 1611  NA 142  K 4.1  CL 105  CO2 29  BUN 29*  CREATININE 1.12  GLUCOSE 95     Lab 06/12/15 1611  HGB 12.5  HCT 37.9  WBC 5.4  PLT 167.0     Lab Results  Component Value Date   TSH 1.06 06/12/2015     Lab Results  Component Value Date   PROBNP 190.0* 06/12/2015       Assessment & Plan:

## 2015-07-26 NOTE — Assessment & Plan Note (Addendum)
-   03/27/15 LHC  LVEDP 15  06/12/2015  Walked RA x 3 laps @ 185 ft each stopped due to sob with nl sats/ fast pace   - PFTs 07/25/2015 s sign obstruction and dlco 74 % - d/c acei 07/25/2015   See prev ddx with ACEi at the top of the usual list of suspects and needs trial off before pursuing alternative dx as we've now excluded the other likely causes (see separate a/p)   In meantime rec she continue gerd diet/ change ppi to h2 bid due to diarrhea on ppi, and f/u in 6 weeks  I had an extended discussion with the patient reviewing all relevant studies completed to date and  lasting 15 to 20 minutes of a 25 minute visit    Each maintenance medication was reviewed in detail including most importantly the difference between maintenance and prns and under what circumstances the prns are to be triggered using an action plan format that is not reflected in the computer generated alphabetically organized AVS.    Please see instructions for details which were reviewed in writing and the patient given a copy highlighting the part that I personally wrote and discussed at today's ov.

## 2015-07-26 NOTE — Assessment & Plan Note (Signed)
In the best review of chronic cough to date ( NEJM 2016 375 [phone]) ,  ACEi are now felt to cause cough in up to  20% of pts which is a 4 fold increase from previous reports and does not include the variety of non-specific complaints we see in pulmonary clinic in pts on ACEi but previously attributed to copd/asthma to include PNDS, throat and chest congestion, "bronchitis", unexplained dyspnea and noct "strangling" sensations as well as atypical /refractory GERD symptoms like atypical dysphagia.   The only way to prove this is not an "ACEi Case" is a trial off ACEi x a minimum of 6 weeks then regroup.   Try diovan 80 mg daily

## 2015-07-28 ENCOUNTER — Other Ambulatory Visit: Payer: Self-pay | Admitting: *Deleted

## 2015-07-28 DIAGNOSIS — R0600 Dyspnea, unspecified: Secondary | ICD-10-CM

## 2015-07-28 MED ORDER — VALSARTAN 160 MG PO TABS: 80.0000 mg | ORAL_TABLET | Freq: Every day | ORAL | Status: DC

## 2015-08-23 ENCOUNTER — Encounter: Payer: Self-pay | Admitting: Cardiology

## 2015-09-05 ENCOUNTER — Ambulatory Visit (INDEPENDENT_AMBULATORY_CARE_PROVIDER_SITE_OTHER): Payer: Medicare Other | Admitting: Internal Medicine

## 2015-09-05 ENCOUNTER — Encounter: Payer: Self-pay | Admitting: Internal Medicine

## 2015-09-05 VITALS — BP 118/70 | HR 84 | Ht 60.0 in | Wt 187.0 lb

## 2015-09-05 DIAGNOSIS — R05 Cough: Secondary | ICD-10-CM | POA: Diagnosis not present

## 2015-09-05 DIAGNOSIS — I10 Essential (primary) hypertension: Secondary | ICD-10-CM | POA: Diagnosis not present

## 2015-09-05 DIAGNOSIS — R0600 Dyspnea, unspecified: Secondary | ICD-10-CM

## 2015-09-05 DIAGNOSIS — R058 Other specified cough: Secondary | ICD-10-CM

## 2015-09-05 NOTE — Progress Notes (Signed)
Subjective:    Patient ID: Connie Daniel, female    DOB: 1941/03/15,    MRN: [id number]    .Brief patient profile:  22 yowf never smoker with severe cough in July 2016 then sob with cough better p 2 rounds of prednisone but not the breathing assoc with about 14 lb wt gain with eval by cardiology > no explanation for sob per Dr Marlou Porch > referred to pulmonary clinic 06/12/2015     06/12/2015 1st [facility] office visit/ Wert   Chief Complaint  Patient presents with  . Pulmonary Consult    Referred by Dr. Marlou Porch.  Pt c/o SOB since Aug 2016.  Pt states that she gets SOB walking approx 20 ft on flat surface and walking up any incline.   onset sob was with cough which was really severe at onset ("the worst ever "? While on ACEi) but mostly cleared then noted  sob pattern variable - sometimes walks fine other times only 20 ft. Not clear when ACEi stopped as still on Dr Kingsley Plan list from ov 05/23/15 and pt absolutely sure she's stopped it but not sure when or why as was not instructed to do so by Dr Marlou Porch Never sob at rest, never had stress test rec Be sure that the med list we provided with these instructions matches up exactly with what you are taking at home and if not call us back right away with corrections Automatically every day take Nexium  40 mg  X  30-60 min before first meal of the day and Pepcid (famotidine)  20 mg one @  bedtime until return to office - this is the best way to tell whether stomach acid is contributing to your problem.  GERD diet    07/25/2015  f/u ov/Wert re: cough/ wheeze/ sob still on ACEi! Chief Complaint  Patient presents with  . Follow-up    Pt c/o siunus congestion, dry cough, wheeze and SOB. Pt states that her breathing has not improved much since LOV.    only new problem is diarrhea since starting ppi rec Stop lisinopril and start diovan 160 mg take  one half daily instead. Stop nexium and just take pepcid after bfast and after supper for at  least then taper down to one daily x 2 weeks and stop and see what if any symptoms flare.     09/05/2015  f/u ov/Wert re: unexplained cough and sob ? All acei related  Chief Complaint  Patient presents with  . Follow-up    Pt states her breathing has slightly improved. She is still having some wheezing and rhinitis.      p d/c ppi diarrhea  Was better then recurred  Coughing better to her satisfaction   No obvious day to day or daytime variability to resp symptoms or assoc excess/ purulent sputum or mucus plugs  or cp or chest tightness,  or overt HB symptoms. No unusual exp hx or h/o childhood pna/ asthma or knowledge of premature birth.  Sleeping ok without nocturnal  or early am exacerbation  of respiratory  c/o's or need for noct saba. Also denies any obvious fluctuation of symptoms with weather or environmental changes or other aggravating or alleviating factors except as outlined above   Current Medications, Allergies, Complete Past Medical History, Past Surgical History, Family History, and Social History were reviewed in [employer] record.  ROS  The following are not active complaints unless bolded sore throat, dysphagia, dental problems, itching, sneezing,  nasal congestion or excess/ purulent secretions, ear ache,   fever, chills, sweats, unintended wt loss, classically pleuritic or exertional cp, hemoptysis,  orthopnea pnd or leg swelling, presyncope, palpitations, abdominal pain, anorexia, nausea, vomiting, diarrhea  or change in bowel or bladder habits, change in stools or urine, dysuria,hematuria,  rash, arthralgias, visual complaints, headache, numbness, weakness or ataxia or problems with walking or coordination,  change in mood/affect or memory.               Objective:   Physical Exam  slt hoarse wf nad/ affect quite unusal   09/05/2015          188  07/25/2015      186   06/12/15 183 lb 3.2 oz (83.099 kg)  05/23/15 184 lb (83.462 kg)    03/27/15 169 lb (76.658 kg)    Vital signs reviewed  HEENT: nl dentition, turbinates, and oropharynx. Nl external ear canals without cough reflex   NECK :  without JVD/Nodes/TM/ nl carotid upstrokes bilaterally   LUNGS: no acc muscle use, clear to A and P bilaterally without cough on insp or exp maneuvers   CV:  RRR  no s3 or murmur or increase in P2, no edema   ABD:  soft and nontender with nl excursion in the supine position. No bruits or organomegaly, bowel sounds nl  MS:  warm without deformities, calf tenderness, cyanosis or clubbing  SKIN: warm and dry without lesions    NEURO:  alert, approp, no deficits   CXR PA and Lateral:   06/12/2015 :    I personally reviewed images and agree with radiology impression as follows:   wnl    Labs reviewed:  Lab 06/12/15 1611  NA 142  K 4.1  CL 105  CO2 29  BUN 29*  CREATININE 1.12  GLUCOSE 95     Lab 06/12/15 1611  HGB 12.5  HCT 37.9  WBC 5.4  PLT 167.0     Lab Results  Component Value Date   TSH 1.06 06/12/2015     Lab Results  Component Value Date   PROBNP 190.0* 06/12/2015       Assessment & Plan:   Outpatient Encounter Prescriptions as of 09/05/2015  Medication Sig  . atorvastatin (LIPITOR) 80 MG tablet Take 80 mg by mouth daily at 6 PM.   . buPROPion (WELLBUTRIN XL) 150 MG 24 hr tablet Take 150 mg by mouth daily.  . Calcium Carb-Cholecalciferol (CALCIUM + D3) 600-200 MG-UNIT TABS Take 2 tablets by mouth daily.  . cholestyramine (QUESTRAN) 4 GM/DOSE powder Take 4 g by mouth 2 (two) times daily with a meal. For IBS  . denosumab (PROLIA) 60 MG/ML SOLN injection Inject 60 mg into the skin every 6 (six) months. Administer in upper arm, thigh, or abdomen  . fexofenadine (ALLEGRA) 180 MG tablet Take 180 mg by mouth daily as needed for allergies or rhinitis.  . fluticasone (FLONASE) 50 MCG/ACT nasal spray Place 1-2 sprays into both nostrils daily as needed.   . furosemide (LASIX) 20 MG tablet Take 1 tablet  (20 mg total) by mouth daily.  Marland Kitchen HYDROcodone-acetaminophen (NORCO) 10-325 MG per tablet Take 0.5-1 tablets by mouth daily as needed for moderate pain.   . metoprolol succinate (TOPROL-XL) 50 MG 24 hr tablet Take 75 mg by mouth daily. Take with or immediately following a meal.  . rivaroxaban (XARELTO) 20 MG TABS tablet Take 1 tablet (20 mg total) by mouth daily with supper.  . sertraline (ZOLOFT) 100  MG tablet Take 100 mg by mouth at bedtime.   . valsartan (DIOVAN) 160 MG tablet Take 0.5 tablets (80 mg total) by mouth daily.  . Vitamin D, Ergocalciferol, (DRISDOL) 50000 UNITS CAPS capsule Take 50,000 Units by mouth every 7 (seven) days. Wednesday  . zolpidem (AMBIEN) 10 MG tablet Take 10 mg by mouth at bedtime.   No facility-administered encounter medications on file as of 09/05/2015.

## 2015-09-05 NOTE — Patient Instructions (Signed)
For drippy nose stop allegra and start zyrtec 10 mg at bedtime  and if not effective ok to try For drainage / throat tickle try take CHLORPHENIRAMINE  4 mg - take one every 4 hours as needed - available over the counter- may cause drowsiness so start with just a bedtime dose or two and see how you tolerate it before trying in daytime    Both of these medication are over the counter  No further pulmonary f/u is needed unless losing ground with breathing or recurrent cough

## 2015-09-09 ENCOUNTER — Encounter: Payer: Self-pay | Admitting: Internal Medicine

## 2015-09-09 DIAGNOSIS — R05 Cough: Secondary | ICD-10-CM | POA: Insufficient documentation

## 2015-09-09 DIAGNOSIS — R058 Other specified cough: Secondary | ICD-10-CM | POA: Insufficient documentation

## 2015-09-09 NOTE — Assessment & Plan Note (Signed)
-   03/27/15 LHC  LVEDP 15  - 06/12/2015  Walked RA x 3 laps @ 185 ft each stopped due to sob with nl sats/ fast pace   - PFTs 07/25/2015 s sign obstruction and dlco 74 % - d/c acei 07/25/2015  - 09/05/2015  Walked RA x 1 laps @ 185 ft each stopped due to  Sob but sats 100%  I had an extended final summary discussion with the patient reviewing all relevant studies completed to date and  lasting 15 to 20 minutes of a 25 minute visit on the following issues:    1) no evidence of a lung problem here but she does seem very deconditioned and anxious >  Follow up per Primary Care planned    2 ) No need for pulmonary meds or f/u at this point

## 2015-09-09 NOTE — Assessment & Plan Note (Signed)
Although even in retrospect it may not be clear the ACEi contributed to the pt's symptoms,  Pt improved off them and adding them back at this point or in the future would risk confusion in interpretation of non-specific respiratory symptoms to which this patient is prone  ie  Better not to muddy the waters here.   bp is fine on diovan 80 mg daily > Follow up per Primary Care planned

## 2015-09-09 NOTE — Assessment & Plan Note (Signed)
Classic Upper airway cough syndrome, so named because it's frequently impossible to sort out how much is  CR/sinusitis with freq throat clearing (which can be related to primary GERD)   vs  causing  secondary (" extra esophageal")  GERD from wide swings in gastric pressure that occur with throat clearing, often  promoting self use of mint and menthol lozenges that reduce the lower esophageal sphincter tone and exacerbate the problem further in a cyclical fashion.   These are the same pts (now being labeled as having "irritable larynx syndrome" by some cough centers) who not infrequently have a history of having failed to tolerate ace inhibitors,  dry powder inhalers or biphosphonates or report having atypical reflux symptoms that don't respond to standard doses of PPI , and are easily confused as having aecopd or asthma flares by even experienced allergists/ pulmonologists.  rec max rx for gerd using h2's since can't apparently tolerate ppi and change antihistamine to either zyrtec or chlorpheniramine, the latter the most drying for pt with pnds but also the most sedating.  F/u is prn

## 2015-09-27 ENCOUNTER — Other Ambulatory Visit: Payer: Self-pay | Admitting: Cardiology

## 2015-12-02 ENCOUNTER — Other Ambulatory Visit: Payer: Self-pay | Admitting: Cardiology

## 2015-12-12 ENCOUNTER — Encounter: Payer: Self-pay | Admitting: Cardiology

## 2015-12-12 ENCOUNTER — Ambulatory Visit (INDEPENDENT_AMBULATORY_CARE_PROVIDER_SITE_OTHER): Payer: Medicare Other | Admitting: Cardiology

## 2015-12-12 VITALS — BP 132/80 | HR 94 | Ht 60.0 in | Wt 190.6 lb

## 2015-12-12 DIAGNOSIS — E669 Obesity, unspecified: Secondary | ICD-10-CM | POA: Diagnosis not present

## 2015-12-12 DIAGNOSIS — I4819 Other persistent atrial fibrillation: Secondary | ICD-10-CM

## 2015-12-12 DIAGNOSIS — Z7901 Long term (current) use of anticoagulants: Secondary | ICD-10-CM | POA: Diagnosis not present

## 2015-12-12 DIAGNOSIS — I481 Persistent atrial fibrillation: Secondary | ICD-10-CM

## 2015-12-12 MED ORDER — ATORVASTATIN CALCIUM 80 MG PO TABS: 80.0000 mg | ORAL_TABLET | Freq: Every day | ORAL | Status: AC

## 2015-12-12 NOTE — Patient Instructions (Signed)

## 2015-12-12 NOTE — Progress Notes (Signed)
[facility]. [address], [address], [place]  [address] Phone: [phone] Fax:  [phone]  Date:  12/12/2015   ID:  YOUA GOELZ, DOB [birth year], MRN [id number]  PCP:  Reginia Naas, MD   History of Present Illness: Connie Daniel is a 75 y.o. female with atrial fibrillation surrounding gallbladder surgery post TEE cardioversion with echocardiogram showing mild LVH, mild mitral valve prolapse and mild mitral regurgitation, nuclear stress test in 2009 showing overall low risk here for followup.  She had lost approximately 30 pounds on Atkins diet. Excellent. Her blood pressure was a little bit on the low side previously in clinic. 100/60. No symptoms.  She is still feeling some shortness of breath when walking up a hill.  Bronchitis - prednisone, two rounds. Still quite short of breath. She had a workup by Dr. Melvyn Novas.  - 03/27/15 LHC LVEDP 15  - 06/12/2015 Walked RA x 3 laps @ 185 ft each stopped due to sob with nl sats/ fast pace  - PFTs 07/25/2015 s sign obstruction and dlco 74 % - d/c acei 07/25/2015  - 09/05/2015 Walked RA x 1 laps @ 185 ft each stopped due to Sob but sats 100%  He stated there was no evidence of lung problem but she did seem very deconditioned and anxious. No need for pulmonary follow-up. ACE inhibitor was changed to ARB. Feeling a little bit better but still short of breath with activity. We discussed weight loss at length, motivation.  Wt Readings from Last 3 Encounters:  12/12/15 190 lb 9.6 oz (86.456 kg)  09/05/15 187 lb (84.823 kg)  07/25/15 186 lb 9.6 oz (84.641 kg)     Past Medical History  Diagnosis Date  . Hyperlipidemia   . Depression   . Hypertension   . Back pain   . Obesity   . PAF (paroxysmal atrial fibrillation) ([place])     a. s/p TEE/DCCV in 2011 surrounding GB surgery.  . Esophageal reflux   . Allergic rhinitis   . Anxiety state, unspecified   . IBS (irritable bowel syndrome)   . Insomnia   . Graves disease     ,  with opthalmolopathy  . Hyperthyroidism   . Breast cancer [facility])     s/p chemo    Past Surgical History  Procedure Laterality Date  . Eye surgery Bilateral     Due to Grave's disease  . Breast lumpectomy Bilateral   . Cholecystectomy  12/29/2009  . Cardiac catheterization N/A 03/27/2015    Procedure: Left Heart Cath and Coronary Angiography;  Surgeon: Jerline Pain, MD;  Location: [facility];  Service: Cardiovascular;  Laterality: N/A;    Current Outpatient Prescriptions  Medication Sig Dispense Refill  . atorvastatin (LIPITOR) 80 MG tablet Take 1 tablet (80 mg total) by mouth daily at 6 PM. 30 tablet 4  . buPROPion (WELLBUTRIN XL) 150 MG 24 hr tablet Take 150 mg by mouth daily.    . Calcium Carb-Cholecalciferol (CALCIUM + D3) 600-200 MG-UNIT TABS Take 2 tablets by mouth daily.    . cholestyramine (QUESTRAN) 4 GM/DOSE powder Take 4 g by mouth 2 (two) times daily with a meal. For IBS    . denosumab (PROLIA) 60 MG/ML SOLN injection Inject 60 mg into the skin every 6 (six) months. Administer in upper arm, thigh, or abdomen    . fexofenadine (ALLEGRA) 180 MG tablet Take 180 mg by mouth daily as needed for allergies or rhinitis.    Marland Kitchen  fluticasone (FLONASE) 50 MCG/ACT nasal spray Place 1-2 sprays into both nostrils daily as needed.     . furosemide (LASIX) 20 MG tablet Take 1 tablet (20 mg total) by mouth daily. 30 tablet 3  . HYDROcodone-acetaminophen (NORCO) 10-325 MG per tablet Take 0.5-1 tablets by mouth daily as needed for moderate pain.   0  . metoprolol succinate (TOPROL-XL) 50 MG 24 hr tablet Take 75 mg by mouth daily. Take with or immediately following a meal.    . sertraline (ZOLOFT) 100 MG tablet Take 100 mg by mouth at bedtime.     . valsartan (DIOVAN) 160 MG tablet Take 0.5 tablets (80 mg total) by mouth daily. 15 tablet 11  . Vitamin D, Ergocalciferol, (DRISDOL) 50000 UNITS CAPS capsule Take 50,000 Units by mouth every 7 (seven) days. Wednesday    . XARELTO 20 MG TABS tablet  TAKE 1 TABLET ONCE DAILY WITH SUPPER. 30 tablet 6  . zolpidem (AMBIEN) 10 MG tablet Take 10 mg by mouth at bedtime.     No current facility-administered medications for this visit.    Allergies:    Allergies  Allergen Reactions  . Sulfa Antibiotics Nausea Only    Social History:  The patient  reports that she has never smoked. She has never used smokeless tobacco. She reports that she drinks alcohol. She reports that she does not use illicit drugs.   ROS:  Please see the history of present illness.   No syncope, no orthopnea, no PND, chest pain    PHYSICAL EXAM: VS:  BP 132/80 mmHg  Pulse 94  Ht 5' (1.524 m)  Wt 190 lb 9.6 oz (86.456 kg)  BMI 37.22 kg/m2 Well nourished, well developed, in no acute distress HEENT: normal Neck: no JVD Cardiac:  Irregularly irregular, normal rate; no murmur Lungs:  clear to auscultation bilaterally, no wheezing, rhonchi or rales Abd: soft, nontender, no hepatomegaly Ext: no edema Skin: warm and dry Neuro: no focal abnormalities noted  EKG: 12/08/13-atrial fibrillation heart rate 75, left anterior fascicular block, poor R wave progression   Cardiac catheterization 03/27/15:  The left ventricular systolic function is normal. Her left ventricular end-diastolic pressure was 10 mmHg, normal correlating to a normal wedge pressure.  Normal coronary arteries  No aortic stenosis  Reassuring catheterization (false positive NUC stress test)  ASSESSMENT AND PLAN:  1. Atrial fibrillation-persistent, permanent, rate controlled.  2. Chronic anticoagulation-continue with Xarelto 20 mg. Creat 1.1. No bleeding. 3. Obesity-excellent job with weight loss but she fortunately has gained back some weight especially after her prednisone treatments.  we continued to spin the majority of our visit discussing this. She will try to exercise despite back pain at times. She has a fitness membership.  4. Hypertension-excellent control. If blood pressure becomes too low,  we may need to pull back medication. ARB. Lasix. She takes this every other day usually. 5. Dyspnea-multifactorial. Improved with weight loss however previously but she is still quite short of breath at times. She is concerned that her BNP may be elevated. we discussed this finding and it is minimally elevated and consistent with prior reading. cardiac catheterization was reassuring showing no CAD, normal left ventricular end-diastolic pressure or wedge pressure. Her echo shows normal left ventricular systolic function. No evidence of significant diastolic dysfunction. reassuring pulmonary medicine evaluation. We will continue with reassurance and conditioning, weight loss. 6. 6 month f/u.   Signed, Candee Furbish, MD [facility]  12/12/2015 5:01 PM

## 2016-01-08 DIAGNOSIS — M810 Age-related osteoporosis without current pathological fracture: Secondary | ICD-10-CM | POA: Diagnosis not present

## 2016-01-17 DIAGNOSIS — H02403 Unspecified ptosis of bilateral eyelids: Secondary | ICD-10-CM | POA: Diagnosis not present

## 2016-01-25 DIAGNOSIS — H02836 Dermatochalasis of left eye, unspecified eyelid: Secondary | ICD-10-CM | POA: Diagnosis not present

## 2016-01-25 DIAGNOSIS — H02833 Dermatochalasis of right eye, unspecified eyelid: Secondary | ICD-10-CM | POA: Diagnosis not present

## 2016-01-31 ENCOUNTER — Telehealth: Payer: Self-pay | Admitting: Cardiology

## 2016-01-31 NOTE — Telephone Encounter (Signed)
New Message   Pt requests to speak with RN about Xarelto. Please advise. IB

## 2016-01-31 NOTE — Telephone Encounter (Signed)
Spoke with pt who is concerned b/c she has decided to look into having blepharoplasty.  She saw a Psychiatric nurse who told her she would be concerned to do the surgery while pt is on Xarelto.  She reports being told if she bleeds into her eyes she could go blind.  Advised I would have no idea how much bleeding she should expect after this kind of surgery and she would need to have the surgeon discuss this with Dr Marlou Porch if she has concerns.  Pt states understanding.

## 2016-03-05 DIAGNOSIS — H0233 Blepharochalasis right eye, unspecified eyelid: Secondary | ICD-10-CM | POA: Diagnosis not present

## 2016-03-05 DIAGNOSIS — H02834 Dermatochalasis of left upper eyelid: Secondary | ICD-10-CM | POA: Diagnosis not present

## 2016-03-05 DIAGNOSIS — H2513 Age-related nuclear cataract, bilateral: Secondary | ICD-10-CM | POA: Diagnosis not present

## 2016-03-05 DIAGNOSIS — H02831 Dermatochalasis of right upper eyelid: Secondary | ICD-10-CM | POA: Diagnosis not present

## 2016-03-05 DIAGNOSIS — Z7901 Long term (current) use of anticoagulants: Secondary | ICD-10-CM | POA: Diagnosis not present

## 2016-03-05 DIAGNOSIS — Z882 Allergy status to sulfonamides status: Secondary | ICD-10-CM | POA: Diagnosis not present

## 2016-03-05 DIAGNOSIS — H02421 Myogenic ptosis of right eyelid: Secondary | ICD-10-CM | POA: Diagnosis not present

## 2016-03-05 DIAGNOSIS — H0236 Blepharochalasis left eye, unspecified eyelid: Secondary | ICD-10-CM | POA: Diagnosis not present

## 2016-03-05 DIAGNOSIS — E0500 Thyrotoxicosis with diffuse goiter without thyrotoxic crisis or storm: Secondary | ICD-10-CM | POA: Diagnosis not present

## 2016-03-05 DIAGNOSIS — Z79899 Other long term (current) drug therapy: Secondary | ICD-10-CM | POA: Diagnosis not present

## 2016-06-18 ENCOUNTER — Other Ambulatory Visit: Payer: Self-pay | Admitting: Family Medicine

## 2016-06-18 DIAGNOSIS — Z1231 Encounter for screening mammogram for malignant neoplasm of breast: Secondary | ICD-10-CM

## 2016-07-09 DIAGNOSIS — M5126 Other intervertebral disc displacement, lumbar region: Secondary | ICD-10-CM | POA: Diagnosis not present

## 2016-07-09 DIAGNOSIS — M5432 Sciatica, left side: Secondary | ICD-10-CM | POA: Diagnosis not present

## 2016-07-09 DIAGNOSIS — R52 Pain, unspecified: Secondary | ICD-10-CM | POA: Diagnosis not present

## 2016-08-11 ENCOUNTER — Other Ambulatory Visit: Payer: Self-pay | Admitting: Cardiology

## 2016-08-12 ENCOUNTER — Ambulatory Visit: Payer: Medicare Other

## 2016-08-14 ENCOUNTER — Ambulatory Visit: Payer: Medicare Other | Admitting: Cardiology

## 2016-08-19 ENCOUNTER — Encounter: Payer: Self-pay | Admitting: Cardiology

## 2016-08-20 ENCOUNTER — Other Ambulatory Visit: Payer: Self-pay | Admitting: *Deleted

## 2016-08-20 ENCOUNTER — Other Ambulatory Visit: Payer: Self-pay | Admitting: Cardiology

## 2016-08-20 MED ORDER — FUROSEMIDE 20 MG PO TABS: 20.0000 mg | ORAL_TABLET | Freq: Every day | ORAL | 0 refills | Status: AC

## 2016-08-20 NOTE — Telephone Encounter (Signed)
Called PCP office [facility] to see if pt had recent lab work for [place] refill since our most recent BMET is from 2016. They had a BMET on file from 08/23/15 - SCr 1.12 and CrCl 58. Pt ok to continue Xarelto 20mg  daily. Refill sent in.

## 2016-08-27 DIAGNOSIS — M5440 Lumbago with sciatica, unspecified side: Secondary | ICD-10-CM | POA: Diagnosis not present

## 2016-08-27 DIAGNOSIS — R29898 Other symptoms and signs involving the musculoskeletal system: Secondary | ICD-10-CM | POA: Diagnosis not present

## 2016-08-27 DIAGNOSIS — R200 Anesthesia of skin: Secondary | ICD-10-CM | POA: Diagnosis not present

## 2016-08-29 ENCOUNTER — Other Ambulatory Visit: Payer: Self-pay | Admitting: Family Medicine

## 2016-08-29 ENCOUNTER — Other Ambulatory Visit: Payer: Self-pay | Admitting: Internal Medicine

## 2016-08-29 DIAGNOSIS — R0600 Dyspnea, unspecified: Secondary | ICD-10-CM

## 2016-08-29 DIAGNOSIS — M5440 Lumbago with sciatica, unspecified side: Secondary | ICD-10-CM

## 2016-08-31 ENCOUNTER — Other Ambulatory Visit: Payer: Self-pay | Admitting: Cardiology

## 2016-08-31 DIAGNOSIS — R0600 Dyspnea, unspecified: Secondary | ICD-10-CM

## 2016-09-02 ENCOUNTER — Ambulatory Visit: Payer: Medicare Other

## 2016-09-03 NOTE — Telephone Encounter (Signed)
Has not had bmet in a year per EPIC and this should have been filled by PCP by now so give one more month so she can arrange to do so or return to see one of our NP's to sort out who will be doing this going forward

## 2016-09-07 ENCOUNTER — Ambulatory Visit
Admission: RE | Admit: 2016-09-07 | Discharge: 2016-09-07 | Disposition: A | Discharge status: Home / Self Care | Payer: Medicare Other | Source: Ambulatory Visit | Attending: Family Medicine | Admitting: Family Medicine

## 2016-09-07 DIAGNOSIS — M5440 Lumbago with sciatica, unspecified side: Secondary | ICD-10-CM

## 2016-09-07 DIAGNOSIS — M5126 Other intervertebral disc displacement, lumbar region: Secondary | ICD-10-CM | POA: Diagnosis not present

## 2016-09-19 ENCOUNTER — Telehealth: Payer: Self-pay | Admitting: Cardiology

## 2016-09-19 NOTE — Telephone Encounter (Signed)
New mESSAGE     Per pt she has been sick and she will call when she is feeling better to reschedule, she did not forget about rescheduling appt.

## 2016-09-19 NOTE — Telephone Encounter (Signed)
noted 

## 2016-09-25 DIAGNOSIS — I10 Essential (primary) hypertension: Secondary | ICD-10-CM | POA: Diagnosis not present

## 2016-09-25 DIAGNOSIS — M4807 Spinal stenosis, lumbosacral region: Secondary | ICD-10-CM | POA: Diagnosis not present

## 2016-09-25 DIAGNOSIS — M4120 Other idiopathic scoliosis, site unspecified: Secondary | ICD-10-CM | POA: Diagnosis not present

## 2016-09-25 DIAGNOSIS — M4317 Spondylolisthesis, lumbosacral region: Secondary | ICD-10-CM | POA: Diagnosis not present

## 2016-10-02 ENCOUNTER — Other Ambulatory Visit: Payer: Self-pay | Admitting: Neurosurgery

## 2016-10-02 DIAGNOSIS — M810 Age-related osteoporosis without current pathological fracture: Secondary | ICD-10-CM

## 2016-10-08 ENCOUNTER — Other Ambulatory Visit: Payer: Medicare Other

## 2016-10-21 ENCOUNTER — Other Ambulatory Visit: Payer: Self-pay | Admitting: Emergency Medicine

## 2016-10-21 DIAGNOSIS — R0600 Dyspnea, unspecified: Secondary | ICD-10-CM

## 2016-10-23 ENCOUNTER — Ambulatory Visit
Admission: RE | Admit: 2016-10-23 | Discharge: 2016-10-23 | Disposition: A | Discharge status: Home / Self Care | Payer: Medicare Other | Source: Ambulatory Visit | Attending: Family Medicine | Admitting: Family Medicine

## 2016-10-23 ENCOUNTER — Ambulatory Visit
Admission: RE | Admit: 2016-10-23 | Discharge: 2016-10-23 | Disposition: A | Discharge status: Home / Self Care | Payer: Medicare Other | Source: Ambulatory Visit | Attending: Neurosurgery | Admitting: Neurosurgery

## 2016-10-23 DIAGNOSIS — Z1231 Encounter for screening mammogram for malignant neoplasm of breast: Secondary | ICD-10-CM

## 2016-10-23 DIAGNOSIS — Z780 Asymptomatic menopausal state: Secondary | ICD-10-CM | POA: Diagnosis not present

## 2016-10-23 DIAGNOSIS — M810 Age-related osteoporosis without current pathological fracture: Secondary | ICD-10-CM

## 2016-11-20 DIAGNOSIS — M48061 Spinal stenosis, lumbar region without neurogenic claudication: Secondary | ICD-10-CM | POA: Diagnosis not present

## 2016-11-25 DIAGNOSIS — M48061 Spinal stenosis, lumbar region without neurogenic claudication: Secondary | ICD-10-CM | POA: Diagnosis not present

## 2016-11-28 DIAGNOSIS — M48061 Spinal stenosis, lumbar region without neurogenic claudication: Secondary | ICD-10-CM | POA: Diagnosis not present

## 2016-12-10 DIAGNOSIS — M48061 Spinal stenosis, lumbar region without neurogenic claudication: Secondary | ICD-10-CM | POA: Diagnosis not present

## 2016-12-13 DIAGNOSIS — M48061 Spinal stenosis, lumbar region without neurogenic claudication: Secondary | ICD-10-CM | POA: Diagnosis not present

## 2016-12-17 DIAGNOSIS — M48061 Spinal stenosis, lumbar region without neurogenic claudication: Secondary | ICD-10-CM | POA: Diagnosis not present

## 2016-12-19 DIAGNOSIS — M48061 Spinal stenosis, lumbar region without neurogenic claudication: Secondary | ICD-10-CM | POA: Diagnosis not present

## 2016-12-25 DIAGNOSIS — M48061 Spinal stenosis, lumbar region without neurogenic claudication: Secondary | ICD-10-CM | POA: Diagnosis not present

## 2016-12-29 IMAGING — NM NM MISC PROCEDURE
6 series · 36 of 36 positions shown · non-contrast
Comparison: none

[Series 1: wbr rest · 6.40mm/px · 6 of 64 frames shown]
[frame 6/64]
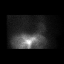
[frame 16/64]
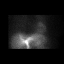
[frame 27/64]
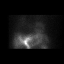
[frame 38/64]
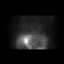
[frame 48/64]
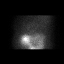
[frame 59/64]
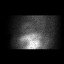

[Series 1: wbr_r-proj_st wbr rest · 6.40mm/px · 6 of 64 frames shown]
[frame 6/64]
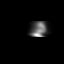
[frame 16/64]
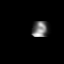
[frame 27/64]
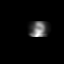
[frame 38/64]
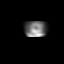
[frame 48/64]
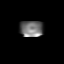
[frame 59/64]
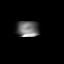

[Series 2: wbr_s-proj_st wbr stress-gsp · 6.40mm/px · 6 of 512 frames shown]
[frame 43/512]
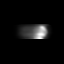
[frame 128/512]
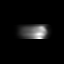
[frame 214/512]
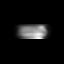
[frame 299/512]
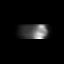
[frame 384/512]
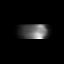
[frame 470/512]
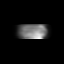

[Series 2: wbr stress-gsp · 6.40mm/px · 6 of 512 frames shown]
[frame 43/512  full-range]
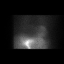
[frame 128/512  full-range]
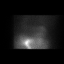
[frame 214/512  full-range]
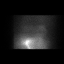
[frame 299/512  full-range]
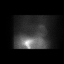
[frame 384/512  full-range]
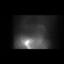
[frame 470/512  full-range]
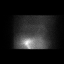

[Series 3: wbr stress-sum-em · 6.40mm/px · 6 of 64 frames shown]
[frame 6/64]
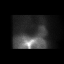
[frame 16/64]
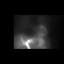
[frame 27/64]
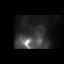
[frame 38/64]
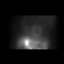
[frame 48/64]
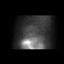
[frame 59/64]
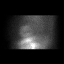

[Series 3: wbr_s-proj_st wbr stress-sum-em · 6.40mm/px · 6 of 64 frames shown]
[frame 6/64]
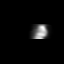
[frame 16/64]
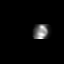
[frame 27/64]
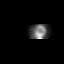
[frame 38/64]
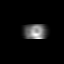
[frame 48/64]
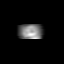
[frame 59/64]
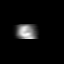

[36 of 36 positions shown; findings below may reference images not displayed]

Canned report from images found in remote index.

Refer to host system for actual result text.

## 2016-12-31 DIAGNOSIS — M48061 Spinal stenosis, lumbar region without neurogenic claudication: Secondary | ICD-10-CM | POA: Diagnosis not present

## 2017-01-02 DIAGNOSIS — M48061 Spinal stenosis, lumbar region without neurogenic claudication: Secondary | ICD-10-CM | POA: Diagnosis not present

## 2017-01-09 DIAGNOSIS — M48061 Spinal stenosis, lumbar region without neurogenic claudication: Secondary | ICD-10-CM | POA: Diagnosis not present

## 2017-01-15 DIAGNOSIS — M48061 Spinal stenosis, lumbar region without neurogenic claudication: Secondary | ICD-10-CM | POA: Diagnosis not present

## 2017-01-17 ENCOUNTER — Other Ambulatory Visit: Payer: Self-pay | Admitting: Internal Medicine

## 2017-01-17 DIAGNOSIS — R0600 Dyspnea, unspecified: Secondary | ICD-10-CM

## 2017-01-17 DIAGNOSIS — M48061 Spinal stenosis, lumbar region without neurogenic claudication: Secondary | ICD-10-CM | POA: Diagnosis not present

## 2017-01-20 DIAGNOSIS — M48061 Spinal stenosis, lumbar region without neurogenic claudication: Secondary | ICD-10-CM | POA: Diagnosis not present

## 2017-01-24 DIAGNOSIS — M48061 Spinal stenosis, lumbar region without neurogenic claudication: Secondary | ICD-10-CM | POA: Diagnosis not present

## 2017-02-04 DIAGNOSIS — M48061 Spinal stenosis, lumbar region without neurogenic claudication: Secondary | ICD-10-CM | POA: Diagnosis not present

## 2017-02-07 DIAGNOSIS — M48061 Spinal stenosis, lumbar region without neurogenic claudication: Secondary | ICD-10-CM | POA: Diagnosis not present

## 2017-02-11 DIAGNOSIS — M48061 Spinal stenosis, lumbar region without neurogenic claudication: Secondary | ICD-10-CM | POA: Diagnosis not present

## 2017-02-14 DIAGNOSIS — M48061 Spinal stenosis, lumbar region without neurogenic claudication: Secondary | ICD-10-CM | POA: Diagnosis not present

## 2017-02-17 DIAGNOSIS — M48061 Spinal stenosis, lumbar region without neurogenic claudication: Secondary | ICD-10-CM | POA: Diagnosis not present

## 2017-02-20 DIAGNOSIS — M48061 Spinal stenosis, lumbar region without neurogenic claudication: Secondary | ICD-10-CM | POA: Diagnosis not present

## 2017-02-24 ENCOUNTER — Ambulatory Visit: Payer: Medicare Other | Admitting: Cardiology

## 2017-02-25 DIAGNOSIS — M48061 Spinal stenosis, lumbar region without neurogenic claudication: Secondary | ICD-10-CM | POA: Diagnosis not present

## 2017-02-27 DIAGNOSIS — I480 Paroxysmal atrial fibrillation: Secondary | ICD-10-CM | POA: Diagnosis not present

## 2017-02-27 DIAGNOSIS — M48061 Spinal stenosis, lumbar region without neurogenic claudication: Secondary | ICD-10-CM | POA: Diagnosis not present

## 2017-02-27 DIAGNOSIS — I10 Essential (primary) hypertension: Secondary | ICD-10-CM | POA: Diagnosis not present

## 2017-02-27 DIAGNOSIS — E785 Hyperlipidemia, unspecified: Secondary | ICD-10-CM | POA: Diagnosis not present

## 2017-02-27 DIAGNOSIS — E039 Hypothyroidism, unspecified: Secondary | ICD-10-CM | POA: Diagnosis not present

## 2017-03-04 DIAGNOSIS — M48061 Spinal stenosis, lumbar region without neurogenic claudication: Secondary | ICD-10-CM | POA: Diagnosis not present

## 2017-03-07 DIAGNOSIS — M48061 Spinal stenosis, lumbar region without neurogenic claudication: Secondary | ICD-10-CM | POA: Diagnosis not present

## 2017-03-12 DIAGNOSIS — M48061 Spinal stenosis, lumbar region without neurogenic claudication: Secondary | ICD-10-CM | POA: Diagnosis not present

## 2017-03-19 DIAGNOSIS — M48061 Spinal stenosis, lumbar region without neurogenic claudication: Secondary | ICD-10-CM | POA: Diagnosis not present

## 2017-03-25 DIAGNOSIS — M48061 Spinal stenosis, lumbar region without neurogenic claudication: Secondary | ICD-10-CM | POA: Diagnosis not present

## 2017-04-09 DIAGNOSIS — M48061 Spinal stenosis, lumbar region without neurogenic claudication: Secondary | ICD-10-CM | POA: Diagnosis not present

## 2017-04-17 DIAGNOSIS — M48061 Spinal stenosis, lumbar region without neurogenic claudication: Secondary | ICD-10-CM | POA: Diagnosis not present

## 2017-05-22 ENCOUNTER — Ambulatory Visit (INDEPENDENT_AMBULATORY_CARE_PROVIDER_SITE_OTHER): Payer: Medicare Other | Admitting: Cardiology

## 2017-05-22 ENCOUNTER — Encounter: Payer: Self-pay | Admitting: Cardiology

## 2017-05-22 VITALS — BP 140/80 | HR 74 | Ht 60.0 in | Wt 165.0 lb

## 2017-05-22 DIAGNOSIS — I480 Paroxysmal atrial fibrillation: Secondary | ICD-10-CM

## 2017-05-22 DIAGNOSIS — I481 Persistent atrial fibrillation: Secondary | ICD-10-CM | POA: Diagnosis not present

## 2017-05-22 DIAGNOSIS — Z79899 Other long term (current) drug therapy: Secondary | ICD-10-CM

## 2017-05-22 DIAGNOSIS — I10 Essential (primary) hypertension: Secondary | ICD-10-CM

## 2017-05-22 DIAGNOSIS — I4819 Other persistent atrial fibrillation: Secondary | ICD-10-CM

## 2017-05-22 NOTE — Patient Instructions (Signed)
Medication Instructions:  The current medical regimen is effective;  continue present plan and medications.  Labwork: Please have blood work today [facility])  Follow-Up: Follow up in 1 year with Dr. Marlou Porch.  You will receive a letter in the mail 2 months before you are due.  Please call us when you receive this letter to schedule your follow up appointment.  If you need a refill on your cardiac medications before your next appointment, please call your pharmacy.  Thank you for choosing [facility]!!

## 2017-05-22 NOTE — Progress Notes (Signed)
[facility]. [address] [place], [place]  [address] Phone: [phone] Fax:  [phone]  Date:  05/22/2017   ID:  Connie Daniel, DOB 11/08/1940, MRN [id number]  PCP:  Carol Ada, MD   History of Present Illness: Connie Daniel is a 76 y.o. female with atrial fibrillation surrounding gallbladder surgery post TEE cardioversion with echocardiogram showing mild LVH, mild mitral valve prolapse and mild mitral regurgitation, nuclear stress test in 2009 showing overall low risk and cardiac catheterization showing no CAD here for followup.  She had a workup by Dr. Melvyn Novas.  - 03/27/15 LHC LVEDP 15  - 06/12/2015 Walked RA x 3 laps @ 185 ft each stopped due to sob with nl sats/ fast pace  - PFTs 07/25/2015 s sign obstruction and dlco 74 % - d/c acei 07/25/2015  - 09/05/2015 Walked RA x 1 laps @ 185 ft each stopped due to Sob but sats 100%  5/18-overall she is doing quite well.  Her breathing has been fairly normal.  Sometimes she may feel some shortness of breath when increasing her exercise activity, on stationary bike for instance.  She has been going to physical therapy and is battling at times back pain.  She has seen Dr. Vertell Limber.  She denies any chest pain, bleeding, syncope, orthopnea, PND.  Wt Readings from Last 3 Encounters:  05/22/17 165 lb (74.8 kg)  12/12/15 190 lb 9.6 oz (86.5 kg)  09/05/15 187 lb (84.8 kg)     Past Medical History:  Diagnosis Date  . Allergic rhinitis   . Anxiety state, unspecified   . Back pain   . Breast cancer ([place])    s/p chemo  . Depression   . Esophageal reflux   . Graves disease    , with opthalmolopathy  . Hyperlipidemia   . Hypertension   . Hyperthyroidism   . IBS (irritable bowel syndrome)   . Insomnia   . Obesity   . PAF (paroxysmal atrial fibrillation) ([place])    a. s/p TEE/DCCV in 2011 surrounding GB surgery.    Past Surgical History:  Procedure Laterality Date  . BREAST LUMPECTOMY Bilateral   . CARDIAC CATHETERIZATION  N/A 03/27/2015   Procedure: Left Heart Cath and Coronary Angiography;  Surgeon: Jerline Pain, MD;  Location: [facility];  Service: Cardiovascular;  Laterality: N/A;  . CHOLECYSTECTOMY  12/29/2009  . EYE SURGERY Bilateral    Due to Grave's disease    Current Outpatient Prescriptions  Medication Sig Dispense Refill  . atorvastatin (LIPITOR) 80 MG tablet Take 1 tablet (80 mg total) by mouth daily at 6 PM. 30 tablet 4  . b complex vitamins capsule Take 1 capsule by mouth daily.    Marland Kitchen buPROPion (WELLBUTRIN XL) 150 MG 24 hr tablet Take 150 mg by mouth daily.    . Calcium Carb-Cholecalciferol (CALCIUM + D3) 600-200 MG-UNIT TABS Take 2 tablets by mouth daily.    . fexofenadine (ALLEGRA) 180 MG tablet Take 180 mg by mouth daily as needed for allergies or rhinitis.    . fluticasone (FLONASE) 50 MCG/ACT nasal spray Place 1-2 sprays into both nostrils daily as needed.     . furosemide (LASIX) 20 MG tablet Take 1 tablet (20 mg total) by mouth daily. 90 tablet 0  . HYDROcodone-acetaminophen (NORCO) 10-325 MG per tablet Take 0.5-1 tablets by mouth daily as needed for moderate pain.   0  . metoprolol succinate (TOPROL-XL) 50 MG 24 hr tablet Take 75 mg  by mouth daily. Take with or immediately following a meal.    . sertraline (ZOLOFT) 100 MG tablet Take 100 mg by mouth at bedtime.     . Vitamin D, Ergocalciferol, (DRISDOL) 50000 UNITS CAPS capsule Take 50,000 Units by mouth every 7 (seven) days. Wednesday    . XARELTO 20 MG TABS tablet TAKE 1 TABLET ONCE DAILY WITH SUPPER. 30 tablet 5  . zolpidem (AMBIEN) 10 MG tablet Take 10 mg by mouth at bedtime.    Marland Kitchen losartan (COZAAR) 100 MG tablet Take 1 tablet (100 mg total) by mouth daily.     No current facility-administered medications for this visit.     Allergies:    Allergies  Allergen Reactions  . Sulfa Antibiotics Nausea Only    Social History:  The patient  reports that she has never smoked. She has never used smokeless tobacco. She reports that  she drinks alcohol. She reports that she does not use drugs.   ROS:  Please see the history of present illness.   No syncope, no orthopnea, no PND, chest pain    PHYSICAL EXAM: VS:  BP 140/80   Pulse 74   Ht 5' (1.524 m)   Wt 165 lb (74.8 kg)   LMP  (LMP Unknown)   BMI 32.22 kg/m  GEN: Well nourished, well developed, in no acute distress  HEENT: normal  Neck: no JVD, carotid bruits, or masses Cardiac: RRR; no murmurs, rubs, or gallops,no edema  Respiratory:  clear to auscultation bilaterally, normal work of breathing GI: soft, nontender, nondistended, + BS MS: no deformity or atrophy  Skin: warm and dry, no rash Neuro:  Alert and Oriented x 3, Strength and sensation are intact Psych: euthymic mood, full affect   EKG: Today 05/22/17-A. fib 74 left axis deviation lateral infarct 12/08/13-atrial fibrillation heart rate 75, left anterior fascicular block, poor R wave progression   - Left ventricle: The cavity size was normal. Systolic function was   normal. The estimated ejection fraction was in the range of 55%   to 60%. Wall motion was normal; there were no regional wall   motion abnormalities. - Mitral valve: Calcified annulus. Mildly thickened leaflets .   There was mild regurgitation. - Left atrium: The atrium was moderately dilated. - Right atrium: The atrium was mildly dilated. - Atrial septum: No defect or patent foramen ovale was identified.  Cardiac catheterization 03/27/15:  The left ventricular systolic function is normal. Her left ventricular end-diastolic pressure was 10 mmHg, normal correlating to a normal wedge pressure.  Normal coronary arteries  No aortic stenosis  Reassuring catheterization (false positive NUC stress test)  ASSESSMENT AND PLAN:  1. Atrial fibrillation- permanent, rate controlled.  Continuing with metoprolol.  Overall doing well. 2. Chronic anticoagulation-continue with Xarelto 20 mg. Creat 1.1. No bleeding.  We will repeat blood  work. 3. Obesity-excellent job with weight loss.  Great job 4. Hypertension-stop valsartan and started losartan because of recall.  Overall doing well.  Minimally elevated today. 5. Dyspnea-multifactorial. Improved with weight loss, cardiac catheterization was reassuring showing no CAD, normal left ventricular end-diastolic pressure or wedge pressure. Her echo shows normal left ventricular systolic function. No evidence of significant diastolic dysfunction. reassuring pulmonary medicine evaluation. We will continue with reassurance and conditioning, weight loss. 6. 12 month f/u.   Signed, Candee Furbish, MD [facility]  05/22/2017 4:03 PM

## 2017-05-23 LAB — CBC
Hematocrit: 40.6 % (ref 34.0–46.6)
Hemoglobin: 13.4 g/dL (ref 11.1–15.9)
MCH: 27.8 pg (ref 26.6–33.0)
MCHC: 33 g/dL (ref 31.5–35.7)
MCV: 84 fL (ref 79–97)
PLATELETS: 220 10*3/uL (ref 150–379)
RBC: 4.82 x10E6/uL (ref 3.77–5.28)
RDW: 14.8 % (ref 12.3–15.4)
WBC: 7.2 10*3/uL (ref 3.4–10.8)

## 2017-05-23 LAB — BASIC METABOLIC PANEL
BUN / CREAT RATIO: 29 — AB (ref 12–28)
BUN: 34 mg/dL — AB (ref 8–27)
CALCIUM: 10.3 mg/dL (ref 8.7–10.3)
CHLORIDE: 100 mmol/L (ref 96–106)
CO2: 26 mmol/L (ref 20–29)
Creatinine, Ser: 1.17 mg/dL — ABNORMAL HIGH (ref 0.57–1.00)
GFR calc Af Amer: 52 mL/min/{1.73_m2} — ABNORMAL LOW (ref >59)
GFR calc non Af Amer: 45 mL/min/{1.73_m2} — ABNORMAL LOW (ref >59)
GLUCOSE: 86 mg/dL (ref 65–99)
Potassium: 4.3 mmol/L (ref 3.5–5.2)
Sodium: 142 mmol/L (ref 134–144)

## 2017-07-09 ENCOUNTER — Telehealth: Payer: Self-pay | Admitting: Cardiology

## 2017-07-09 NOTE — Telephone Encounter (Signed)
Patient calling the office for samples of medication:   1.  What medication and dosage are you requesting samples for? xarelto 20mg  2.  Are you currently out of this medication?no have a  Week left

## 2017-07-10 NOTE — Telephone Encounter (Signed)
Called patient to let her know that I would place two weeks of samples at the front desk. No answer and vm was unidentified. Will try again later.

## 2017-07-11 NOTE — Telephone Encounter (Signed)
Patient has not picked samples up yet. I called her to be sure that she was aware that they were placed at the front desk. Did not get an answer, vm unidentified.

## 2017-08-15 ENCOUNTER — Other Ambulatory Visit: Payer: Self-pay | Admitting: Cardiology

## 2017-08-15 NOTE — Telephone Encounter (Signed)
Xarelto 20mg  refill request received; pt is 77 yrs old, wt-74.8kg, Crea-1.17 on 05/22/17, last seen by Dr. Marlou Porch on 05/22/17, CrCl-55.35ml/min. Refill request sent to requested pharmacy.

## 2018-07-27 ENCOUNTER — Other Ambulatory Visit: Payer: Self-pay | Admitting: Cardiology

## 2018-07-27 NOTE — Telephone Encounter (Signed)
Pt overdue for annual appt with Dr Marlou Porch as well as BMET/CBC. Per last BMET in October 2018, CrCl 70mL/min and pt would qualify for lower dose of Xarelto 15mg  daily. LMOM for pt to return call so that we can recheck labs to confirm that dose change is still needed.

## 2018-08-27 ENCOUNTER — Encounter: Payer: Self-pay | Admitting: Cardiology

## 2018-09-08 ENCOUNTER — Other Ambulatory Visit: Payer: Self-pay | Admitting: Cardiology

## 2018-09-08 NOTE — Telephone Encounter (Signed)
Patient has not had renal function checked since October 2018. LMOM for patient - will need BMET and CBC checked prior to sending in Xarelto refill.

## 2018-09-11 NOTE — Telephone Encounter (Signed)
Pt scheduled follow up visit with Dr Marlou Porch in 1 week. Will send in 1 month rx and reevaluate once labs drawn at f/u visit.

## 2018-09-15 DIAGNOSIS — R441 Visual hallucinations: Secondary | ICD-10-CM | POA: Diagnosis not present

## 2018-09-15 DIAGNOSIS — E559 Vitamin D deficiency, unspecified: Secondary | ICD-10-CM | POA: Diagnosis not present

## 2018-09-15 DIAGNOSIS — E039 Hypothyroidism, unspecified: Secondary | ICD-10-CM | POA: Diagnosis not present

## 2018-09-15 DIAGNOSIS — E785 Hyperlipidemia, unspecified: Secondary | ICD-10-CM | POA: Diagnosis not present

## 2018-09-18 ENCOUNTER — Encounter: Payer: Self-pay | Admitting: Cardiology

## 2018-09-18 ENCOUNTER — Ambulatory Visit: Payer: Medicare Other | Admitting: Cardiology

## 2018-09-18 ENCOUNTER — Encounter (INDEPENDENT_AMBULATORY_CARE_PROVIDER_SITE_OTHER): Payer: Self-pay

## 2018-09-18 VITALS — BP 124/60 | HR 77 | Ht 60.0 in | Wt 171.1 lb

## 2018-09-18 DIAGNOSIS — I10 Essential (primary) hypertension: Secondary | ICD-10-CM

## 2018-09-18 DIAGNOSIS — R0609 Other forms of dyspnea: Secondary | ICD-10-CM

## 2018-09-18 DIAGNOSIS — I480 Paroxysmal atrial fibrillation: Secondary | ICD-10-CM

## 2018-09-18 DIAGNOSIS — R0600 Dyspnea, unspecified: Secondary | ICD-10-CM

## 2018-09-18 NOTE — Progress Notes (Signed)
[place]. [address], [place]  [address] Phone: [phone] Fax:  [phone]  Date:  09/18/2018   ID:  Connie Daniel, DOB 1941/03/09, MRN [id number]  PCP:  Carol Ada, MD   History of Present Illness: Connie Daniel is a 78 y.o. female with atrial fibrillation surrounding gallbladder surgery post TEE cardioversion with echocardiogram showing mild LVH, mild mitral valve prolapse and mild mitral regurgitation, nuclear stress test in 2009 showing overall low risk and cardiac catheterization showing no CAD here for followup.  She had a workup by Dr. Melvyn Novas.  - 03/27/15 LHC LVEDP 15  - 06/12/2015 Walked RA x 3 laps @ 185 ft each stopped due to sob with nl sats/ fast pace  - PFTs 07/25/2015 s sign obstruction and dlco 74 % - d/c acei 07/25/2015  - 09/05/2015 Walked RA x 1 laps @ 185 ft each stopped due to Sob but sats 100%  5/18-overall she is doing quite well.  Her breathing has been fairly normal.  Sometimes she may feel some shortness of breath when increasing her exercise activity, on stationary bike for instance.  She has been going to physical therapy and is battling at times back pain.  She has seen Dr. Vertell Limber.  She denies any chest pain, bleeding, syncope, orthopnea, PND.  09/18/2018- here for the follow-up of atrial fibrillation paroxysmal surrounding gallbladder disease surgery.  She had a TEE cardioversion and echocardiogram that showed mild mitral valve prolapse.  Mild regurgitation.  In 2009 had a cardiac catheterization showing no significant CAD. Overall doing quite well.  Atrial fibrillation is under great control.  No significant chest pain or fevers chills nausea vomiting syncope bleeding.  Occasionally she will note some increased edema on her ankles.  She will take an additional Lasix if that is the case.  Sometimes she may feel some mild shortness of breath with increased fluid as well.  Overall though she is doing quite well.      Wt Readings from  Last 3 Encounters:  09/18/18 171 lb 1.9 oz (77.6 kg)  05/22/17 165 lb (74.8 kg)  12/12/15 190 lb 9.6 oz (86.5 kg)     Past Medical History:  Diagnosis Date  . Allergic rhinitis   . Anxiety state, unspecified   . Back pain   . Breast cancer ([facility])    s/p chemo  . Depression   . Esophageal reflux   . Graves disease    , with opthalmolopathy  . Hyperlipidemia   . Hypertension   . Hyperthyroidism   . IBS (irritable bowel syndrome)   . Insomnia   . Obesity   . PAF (paroxysmal atrial fibrillation) ([place])    a. s/p TEE/DCCV in 2011 surrounding GB surgery.    Past Surgical History:  Procedure Laterality Date  . BREAST LUMPECTOMY Bilateral   . CARDIAC CATHETERIZATION N/A 03/27/2015   Procedure: Left Heart Cath and Coronary Angiography;  Surgeon: Jerline Pain, MD;  Location: [facility];  Service: Cardiovascular;  Laterality: N/A;  . CHOLECYSTECTOMY  12/29/2009  . EYE SURGERY Bilateral    Due to Grave's disease    Current Outpatient Medications  Medication Sig Dispense Refill  . atorvastatin (LIPITOR) 80 MG tablet Take 1 tablet (80 mg total) by mouth daily at 6 PM. 30 tablet 4  . b complex vitamins capsule Take 1 capsule by mouth daily.    Marland Kitchen buPROPion (WELLBUTRIN XL) 150 MG 24 hr tablet Take 150 mg by  mouth daily.    . Calcium Carb-Cholecalciferol (CALCIUM + D3) 600-200 MG-UNIT TABS Take 2 tablets by mouth daily.    . fexofenadine (ALLEGRA) 180 MG tablet Take 180 mg by mouth daily as needed for allergies or rhinitis.    . fluticasone (FLONASE) 50 MCG/ACT nasal spray Place 1-2 sprays into both nostrils daily as needed.     . furosemide (LASIX) 20 MG tablet Take 1 tablet (20 mg total) by mouth daily. 90 tablet 0  . HYDROcodone-acetaminophen (NORCO) 10-325 MG per tablet Take 0.5-1 tablets by mouth daily as needed for moderate pain.   0  . losartan (COZAAR) 100 MG tablet Take 1 tablet (100 mg total) by mouth daily.    . metoprolol succinate (TOPROL-XL) 50 MG 24 hr tablet Take 75  mg by mouth daily. Take with or immediately following a meal.    . sertraline (ZOLOFT) 100 MG tablet Take 100 mg by mouth at bedtime.     . Vitamin D, Ergocalciferol, (DRISDOL) 50000 UNITS CAPS capsule Take 50,000 Units by mouth every 7 (seven) days. Wednesday    . XARELTO 20 MG TABS tablet TAKE 1 TABLET DAILY WITH SUPPER. 30 tablet 0  . zolpidem (AMBIEN) 10 MG tablet Take 10 mg by mouth at bedtime.     No current facility-administered medications for this visit.     Allergies:    Allergies  Allergen Reactions  . Sulfa Antibiotics Nausea Only    Social History:  The patient  reports that she has never smoked. She has never used smokeless tobacco. She reports current alcohol use. She reports that she does not use drugs.   ROS:  Please see the history of present illness.      PHYSICAL EXAM: VS:  BP 124/60   Pulse 77   Ht 5' (1.524 m)   Wt 171 lb 1.9 oz (77.6 kg)   LMP  (LMP Unknown)   SpO2 96%   BMI 33.42 kg/m  GEN: Well nourished, well developed, in no acute distress  HEENT: normal  Neck: no JVD, carotid bruits, or masses Cardiac: irreg; no murmurs, rubs, or gallops,no edema  Respiratory:  clear to auscultation bilaterally, normal work of breathing GI: soft, nontender, nondistended, + BS MS: no deformity or atrophy  Skin: warm and dry, no rash Neuro:  Alert and Oriented x 3, Strength and sensation are intact Psych: euthymic mood, full affect     EKG: Today 09/18/2018- atrial fibrillation heart rate 77 left axis deviation. 05/22/17-A. fib 74 left axis deviation lateral infarct 12/08/13-atrial fibrillation heart rate 75, left anterior fascicular block, poor R wave progression   - Left ventricle: The cavity size was normal. Systolic function was   normal. The estimated ejection fraction was in the range of 55%   to 60%. Wall motion was normal; there were no regional wall   motion abnormalities. - Mitral valve: Calcified annulus. Mildly thickened leaflets .   There was mild  regurgitation. - Left atrium: The atrium was moderately dilated. - Right atrium: The atrium was mildly dilated. - Atrial septum: No defect or patent foramen ovale was identified.  Cardiac catheterization 03/27/15:  The left ventricular systolic function is normal. Her left ventricular end-diastolic pressure was 10 mmHg, normal correlating to a normal wedge pressure.  Normal coronary arteries  No aortic stenosis  Reassuring catheterization (false positive NUC stress test)  ASSESSMENT AND PLAN:  1. Atrial fibrillation- permanent, rate controlled, 77.  Continuing with metoprolol.  Doing good rate control work 2. Chronic  anticoagulation-continue with Xarelto 20 mg. Creat 1.1 overall been doing quite well.  No obvious signs of bleeding..  3. Obesity-continue to work on diet.  Exercise. 4. Hypertension-stop valsartan and started losartan because of recall.  Overall doing well.  No changes made.  Well-controlled 5. Dyspnea-multifactorial. Improved with weight loss, cardiac catheterization was reassuring showing no CAD, normal left ventricular end-diastolic pressure or wedge pressure. Her echo shows normal left ventricular systolic function. No evidence of significant diastolic dysfunction. reassuring pulmonary medicine evaluation.  No changes, conditioning, weight loss.  Lasix as needed. 6. 12 month f/u.   Signed, Candee Furbish, MD [facility]  09/18/2018 3:51 PM

## 2018-09-18 NOTE — Patient Instructions (Signed)
Medication Instructions:  The current medical regimen is effective;  continue present plan and medications.  If you need a refill on your cardiac medications before your next appointment, please call your pharmacy.   Follow-Up: At [facility], you and your health needs are our priority.  As part of our continuing mission to provide you with exceptional heart care, we have created designated Provider Care Teams.  These Care Teams include your primary Cardiologist (physician) and Advanced Practice Providers (APPs -  Physician Assistants and Nurse Practitioners) who all work together to provide you with the care you need, when you need it. You will need a follow up appointment in 12 months.  Please call our office 2 months in advance to schedule this appointment.  You may see Mark Skains, MD or one of the following Advanced Practice Providers on your designated Care Team:   Lori Gerhardt, NP Laura Ingold, NP . Jill McDaniel, NP  Thank you for choosing [facility]!!      

## 2018-10-14 DIAGNOSIS — F3342 Major depressive disorder, recurrent, in full remission: Secondary | ICD-10-CM | POA: Diagnosis not present

## 2018-10-14 DIAGNOSIS — Z72820 Sleep deprivation: Secondary | ICD-10-CM | POA: Diagnosis not present

## 2018-10-29 ENCOUNTER — Other Ambulatory Visit: Payer: Self-pay | Admitting: Cardiology

## 2019-01-19 DIAGNOSIS — I10 Essential (primary) hypertension: Secondary | ICD-10-CM | POA: Diagnosis not present

## 2019-01-19 DIAGNOSIS — E785 Hyperlipidemia, unspecified: Secondary | ICD-10-CM | POA: Diagnosis not present

## 2019-01-19 DIAGNOSIS — Z1389 Encounter for screening for other disorder: Secondary | ICD-10-CM | POA: Diagnosis not present

## 2019-01-19 DIAGNOSIS — Z0000 Encounter for general adult medical examination without abnormal findings: Secondary | ICD-10-CM | POA: Diagnosis not present

## 2019-01-26 ENCOUNTER — Other Ambulatory Visit: Payer: Self-pay | Admitting: Family Medicine

## 2019-01-26 DIAGNOSIS — R440 Auditory hallucinations: Secondary | ICD-10-CM

## 2019-01-26 DIAGNOSIS — R441 Visual hallucinations: Secondary | ICD-10-CM

## 2019-02-16 ENCOUNTER — Other Ambulatory Visit: Payer: Self-pay | Admitting: Cardiology

## 2019-02-16 NOTE — Telephone Encounter (Signed)
Prescription refill request for Xarelto received.   Last office visit: Skains (09-18-2018) Weight: 171 lbs (09-18-2018) Age: 78 Scr:1.11 (01/19/2019 Faxed from pt's PCP) CrCl: 60.7 ml/min  Prescription refill sent.

## 2019-02-24 ENCOUNTER — Other Ambulatory Visit: Payer: Self-pay | Admitting: Family Medicine

## 2019-02-26 ENCOUNTER — Ambulatory Visit
Admission: RE | Admit: 2019-02-26 | Discharge: 2019-02-26 | Disposition: A | Discharge status: Home / Self Care | Payer: Medicare Other | Source: Ambulatory Visit | Attending: Family Medicine | Admitting: Family Medicine

## 2019-02-26 ENCOUNTER — Other Ambulatory Visit: Payer: Self-pay

## 2019-02-26 DIAGNOSIS — R440 Auditory hallucinations: Secondary | ICD-10-CM

## 2019-02-26 DIAGNOSIS — R51 Headache: Secondary | ICD-10-CM | POA: Diagnosis not present

## 2019-02-26 DIAGNOSIS — R441 Visual hallucinations: Secondary | ICD-10-CM

## 2019-02-26 DIAGNOSIS — R443 Hallucinations, unspecified: Secondary | ICD-10-CM | POA: Diagnosis not present

## 2019-03-29 DIAGNOSIS — M810 Age-related osteoporosis without current pathological fracture: Secondary | ICD-10-CM | POA: Diagnosis not present

## 2019-03-29 DIAGNOSIS — I10 Essential (primary) hypertension: Secondary | ICD-10-CM | POA: Diagnosis not present

## 2019-03-29 DIAGNOSIS — I4891 Unspecified atrial fibrillation: Secondary | ICD-10-CM | POA: Diagnosis not present

## 2019-03-29 DIAGNOSIS — I480 Paroxysmal atrial fibrillation: Secondary | ICD-10-CM | POA: Diagnosis not present

## 2019-04-13 ENCOUNTER — Telehealth: Payer: Self-pay | Admitting: Cardiology

## 2019-04-13 NOTE — Telephone Encounter (Signed)
Thanks for update. Very nice.  Candee Furbish, MD

## 2019-04-13 NOTE — Telephone Encounter (Signed)
New message   Patient is moving to [place], [place]. Patient wants to tell Dr. Marlou Porch that she appreciates all his care and kindness.
